# Patient Record
Sex: Male | Born: 1995 | Race: White | Hispanic: No | Marital: Single | Smoking: Current every day smoker
Health system: Southern US, Community
[De-identification: ages and names within clinical notes are randomized; demographics above are authoritative.]

## PROBLEM LIST (undated history)

## (undated) DIAGNOSIS — F329 Major depressive disorder, single episode, unspecified: Secondary | ICD-10-CM

## (undated) DIAGNOSIS — G43909 Migraine, unspecified, not intractable, without status migrainosus: Secondary | ICD-10-CM

## (undated) DIAGNOSIS — F32A Depression, unspecified: Secondary | ICD-10-CM

## (undated) DIAGNOSIS — E079 Disorder of thyroid, unspecified: Secondary | ICD-10-CM

---
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
## 2015-08-22 ENCOUNTER — Emergency Department (HOSPITAL_COMMUNITY)
Admission: EM | Admit: 2015-08-22 | Discharge: 2015-08-22 | Disposition: A | Discharge status: Home / Self Care | Payer: Medicaid Other | Attending: Emergency Medicine | Admitting: Emergency Medicine

## 2015-08-22 ENCOUNTER — Encounter (HOSPITAL_COMMUNITY): Payer: Self-pay | Admitting: Emergency Medicine

## 2015-08-22 DIAGNOSIS — Z8679 Personal history of other diseases of the circulatory system: Secondary | ICD-10-CM | POA: Insufficient documentation

## 2015-08-22 DIAGNOSIS — Z720 Tobacco use: Secondary | ICD-10-CM | POA: Diagnosis not present

## 2015-08-22 DIAGNOSIS — E079 Disorder of thyroid, unspecified: Secondary | ICD-10-CM | POA: Diagnosis not present

## 2015-08-22 DIAGNOSIS — L02415 Cutaneous abscess of right lower limb: Secondary | ICD-10-CM | POA: Diagnosis present

## 2015-08-22 DIAGNOSIS — Z8659 Personal history of other mental and behavioral disorders: Secondary | ICD-10-CM | POA: Diagnosis not present

## 2015-08-22 DIAGNOSIS — Z79899 Other long term (current) drug therapy: Secondary | ICD-10-CM | POA: Diagnosis not present

## 2015-08-22 DIAGNOSIS — Z23 Encounter for immunization: Secondary | ICD-10-CM | POA: Insufficient documentation

## 2015-08-22 DIAGNOSIS — L03115 Cellulitis of right lower limb: Secondary | ICD-10-CM | POA: Diagnosis not present

## 2015-08-22 MED ORDER — HYDROCODONE-ACETAMINOPHEN 5-325 MG PO TABS: 1.0000 | ORAL_TABLET | Freq: Four times a day (QID) | ORAL | Status: DC | PRN

## 2015-08-22 MED ORDER — TRAMADOL HCL 50 MG PO TABS
50.0000 mg | ORAL_TABLET | Freq: Once | ORAL | Status: DC
  Filled 2015-08-22: qty 1

## 2015-08-22 MED ORDER — LIDOCAINE-EPINEPHRINE 2 %-1:100000 IJ SOLN
20.0000 mL | Freq: Once | INTRAMUSCULAR | Status: AC
  Administered 2015-08-22: 20 mL via INTRADERMAL
  Filled 2015-08-22: qty 1

## 2015-08-22 MED ORDER — LIDOCAINE-EPINEPHRINE 1 %-1:100000 IJ SOLN
INTRAMUSCULAR | Status: AC
  Filled 2015-08-22: qty 1

## 2015-08-22 MED ORDER — DOXYCYCLINE HYCLATE 100 MG PO CAPS: 100.0000 mg | ORAL_CAPSULE | Freq: Two times a day (BID) | ORAL | Status: DC

## 2015-08-22 MED ORDER — TETANUS-DIPHTH-ACELL PERTUSSIS 5-2.5-18.5 LF-MCG/0.5 IM SUSP
0.5000 mL | Freq: Once | INTRAMUSCULAR | Status: AC
  Administered 2015-08-22: 0.5 mL via INTRAMUSCULAR
  Filled 2015-08-22: qty 0.5

## 2015-08-22 MED ORDER — DOXYCYCLINE HYCLATE 100 MG PO TABS
100.0000 mg | ORAL_TABLET | Freq: Once | ORAL | Status: AC
  Administered 2015-08-22: 100 mg via ORAL
  Filled 2015-08-22: qty 1

## 2015-08-22 NOTE — Discharge Instructions (Signed)
Apply warm compress to affected area 4 times daily.  Take antibiotic and pain medication as prescribed.  Follow up with your doctor or return in 48 hrs for wound recheck and packing removal.   Cellulitis Cellulitis is an infection of the skin and the tissue beneath it. The infected area is usually red and tender. Cellulitis occurs most often in the arms and lower legs.  CAUSES  Cellulitis is caused by bacteria that enter the skin through cracks or cuts in the skin. The most common types of bacteria that cause cellulitis are staphylococci and streptococci. SIGNS AND SYMPTOMS   Redness and warmth.  Swelling.  Tenderness or pain.  Fever. DIAGNOSIS  Your health care provider can usually determine what is wrong based on a physical exam. Blood tests may also be done. TREATMENT  Treatment usually involves taking an antibiotic medicine. HOME CARE INSTRUCTIONS   Take your antibiotic medicine as directed by your health care provider. Finish the antibiotic even if you start to feel better.  Keep the infected arm or leg elevated to reduce swelling.  Apply a warm cloth to the affected area up to 4 times per day to relieve pain.  Take medicines only as directed by your health care provider.  Keep all follow-up visits as directed by your health care provider. SEEK MEDICAL CARE IF:   You notice red streaks coming from the infected area.  Your red area gets larger or turns dark in color.  Your bone or joint underneath the infected area becomes painful after the skin has healed.  Your infection returns in the same area or another area.  You notice a swollen bump in the infected area.  You develop new symptoms.  You have a fever. SEEK IMMEDIATE MEDICAL CARE IF:   You feel very sleepy.  You develop vomiting or diarrhea.  You have a general ill feeling (malaise) with muscle aches and pains.   This information is not intended to replace advice given to you by your health care provider.  Make sure you discuss any questions you have with your health care provider.   Document Released: 08/06/2005 Document Revised: 07/18/2015 Document Reviewed: 01/12/2012 Elsevier Interactive Patient Education 2016 [employer]  Abscess An abscess (boil or furuncle) is an infected area on or under the skin. This area is filled with yellowish-white fluid (pus) and other material (debris). HOME CARE   Only take medicines as told by your doctor.  If you were given antibiotic medicine, take it as directed. Finish the medicine even if you start to feel better.  If gauze is used, follow your doctor's directions for changing the gauze.  To avoid spreading the infection:  Keep your abscess covered with a bandage.  Wash your hands well.  Do not share personal care items, towels, or whirlpools with others.  Avoid skin contact with others.  Keep your skin and clothes clean around the abscess.  Keep all doctor visits as told. GET HELP RIGHT AWAY IF:   You have more pain, puffiness (swelling), or redness in the wound site.  You have more fluid or blood coming from the wound site.  You have muscle aches, chills, or you feel sick.  You have a fever. MAKE SURE YOU:   Understand these instructions.  Will watch your condition.  Will get help right away if you are not doing well or get worse.   This information is not intended to replace advice given to you by your health care provider. Make sure  you discuss any questions you have with your health care provider.   Document Released: 04/14/2008 Document Revised: 04/27/2012 Document Reviewed: 01/10/2012 Elsevier Interactive Patient Education [employer]

## 2015-08-22 NOTE — ED Notes (Signed)
Pt c/o abscess to back of rt calf x 4 days.  Has multiple sores on lower leg.  States that these have been there for years.

## 2015-08-22 NOTE — ED Provider Notes (Signed)
CSN: [id number]     Arrival date & time 08/22/15  1350 History   Chief Complaint  Patient presents with  . Abscess   The history is provided by the patient. No language interpreter was used.   HPI Comments: [name] is a 19 y.o. male who presents to the Emergency Department complaining of abscess to the back of his right calf for 4 days. Patient report 1 weeks ago he was walking the woods and he thinks he may have been bitten by something to his right calf. States it did not really bother him until the past 4 days he has noticed increased pain and swelling and redness. Describe pain as a throbbing sensation, persistent, worsening with palpation. Denies having fever chills abdominal cramping nausea vomiting or diarrhea. Denies any specific injury. No prior history of PE or DVT. 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 He is unable to recall last tetanus status. He has been taken at home without adequate relief.   Past Medical History  Diagnosis Date  . Migraines   . Thyroid disease   . Depression    Past Surgical History  Procedure Laterality Date  . I&d extremity Left 01/27/2014    Procedure: IRRIGATION AND DEBRIDEMENT EXTREMITY LEFT WRIST;  Surgeon: Tami Ribas, MD;  Location: [facility] OR;  Service: Orthopedics;  Laterality: Left;   No family history on file. Social History  Substance Use Topics  . Smoking status: Current Every Day Smoker  . Smokeless tobacco: None  . Alcohol Use: No    Review of Systems  Constitutional: Negative for fever.  Skin: Positive for rash.  Neurological: Negative for numbness.    Allergies  Sulfa antibiotics  Home Medications   Prior to Admission medications   Medication Sig Start Date End Date Taking? Authorizing Provider  acetaminophen (TYLENOL) 500 MG tablet Take 1,000-2,000 mg by mouth every 6 (six) hours as needed (for pain).     Historical Provider, MD  cephALEXin (KEFLEX) 500 MG  capsule Take 2 capsules (1,000 mg total) by mouth 2 (two) times daily. 08/28/14   Fayrene Helper, PA-C  clindamycin (CLEOCIN) 150 MG capsule Take 1 capsule (150 mg total) by mouth every 6 (six) hours. 08/28/14   Fayrene Helper, PA-C  levothyroxine (SYNTHROID, LEVOTHROID) 88 MCG tablet Take 88 mcg by mouth daily.      Historical Provider, MD  oxyCODONE-acetaminophen (PERCOCET/ROXICET) 5-325 MG per tablet Take 2 tablets by mouth every 6 (six) hours as needed for severe pain. 08/28/14   Fayrene Helper, PA-C   BP 137/82 mmHg  Pulse 136  Temp(Src) 99.2 F (37.3 C) (Oral)  Resp 20  SpO2 99% Physical Exam  Constitutional: He appears well-developed and well-nourished. No distress.  HENT:  Head: Atraumatic.  Eyes: Conjunctivae are normal.  Neck: Neck supple.  Cardiovascular: Intact distal pulses.   Neurological: He is alert.  Skin: No rash (right medial calf region with an area of induration, fluctuance, and surrounding skin erythema measuring 4 x 7 cm, tenderness to palpation. Compartment is soft.) noted.  Psychiatric: He has a normal mood and affect.  Nursing note and vitals reviewed.  ED Course  Procedures  DIAGNOSTIC STUDIES: Oxygen Saturation is 99% on RA, normal by my interpretation.    COORDINATION OF CARE: 3:07 PM  Pt developed abscess cellulitis to R calf from a ?insect bite 1 week ago.  Successful I&D performed by me.  No report of tick bite.  Pt is allergic  to Sulfa.  Plan to d/c with doxy and 48 hrs f/u.  INCISION AND DRAINAGE Performed by: Fayrene HelperRAN,Camira Geidel Consent: Verbal consent obtained. Risks and benefits: risks, benefits and alternatives were discussed Type: abscess  Body area: R calf region  Anesthesia: local infiltration  Incision was made with a scalpel.  Local anesthetic: lidocaine 2% w epinephrine  Anesthetic total: 4 ml  Complexity: complex Blunt dissection to break up loculations  Drainage: purulent  Drainage amount: moderate  Packing material: 1/4 in iodoform  gauze  Patient tolerance: Patient tolerated the procedure well with no immediate complications.      Labs Review Labs Reviewed - No data to display  Imaging Review No results found. I have personally reviewed and evaluated lab results as part of my medical decision-making.   EKG Interpretation None      MDM   Final diagnoses:  Abscess of right lower leg  Cellulitis of right lower leg   BP 128/61 mmHg  Pulse 97  Temp(Src) 99 F (37.2 C) (Oral)  Resp 18  SpO2 100%   I, Dastan Krider, personally performed the services described in this documentation. All medical record entries made by the scribe were at my direction and in my presence.  I have reviewed the chart and discharge instructions and agree that the record reflects my personal performance and is accurate and complete. Emylee Decelle.  08/22/2015. 4:43 PM.      Fayrene HelperBowie Daelyn Mozer, PA-C 08/22/15 1643  Fayrene HelperBowie Militza Devery, PA-C 08/22/15 1644  Doug SouSam Jacubowitz, MD 08/23/15 [phone]

## 2016-06-18 IMAGING — CR DG FOREARM 2V*R*
2 series · 2 of 2 positions shown · non-contrast
Comparison: None.

CLINICAL DATA: Puncture wound 1 week ago approximately 1 inch above
the wrist now with swelling and erythema here

EXAM:
RIGHT FOREARM - 2 VIEW

[x forearm ap right]
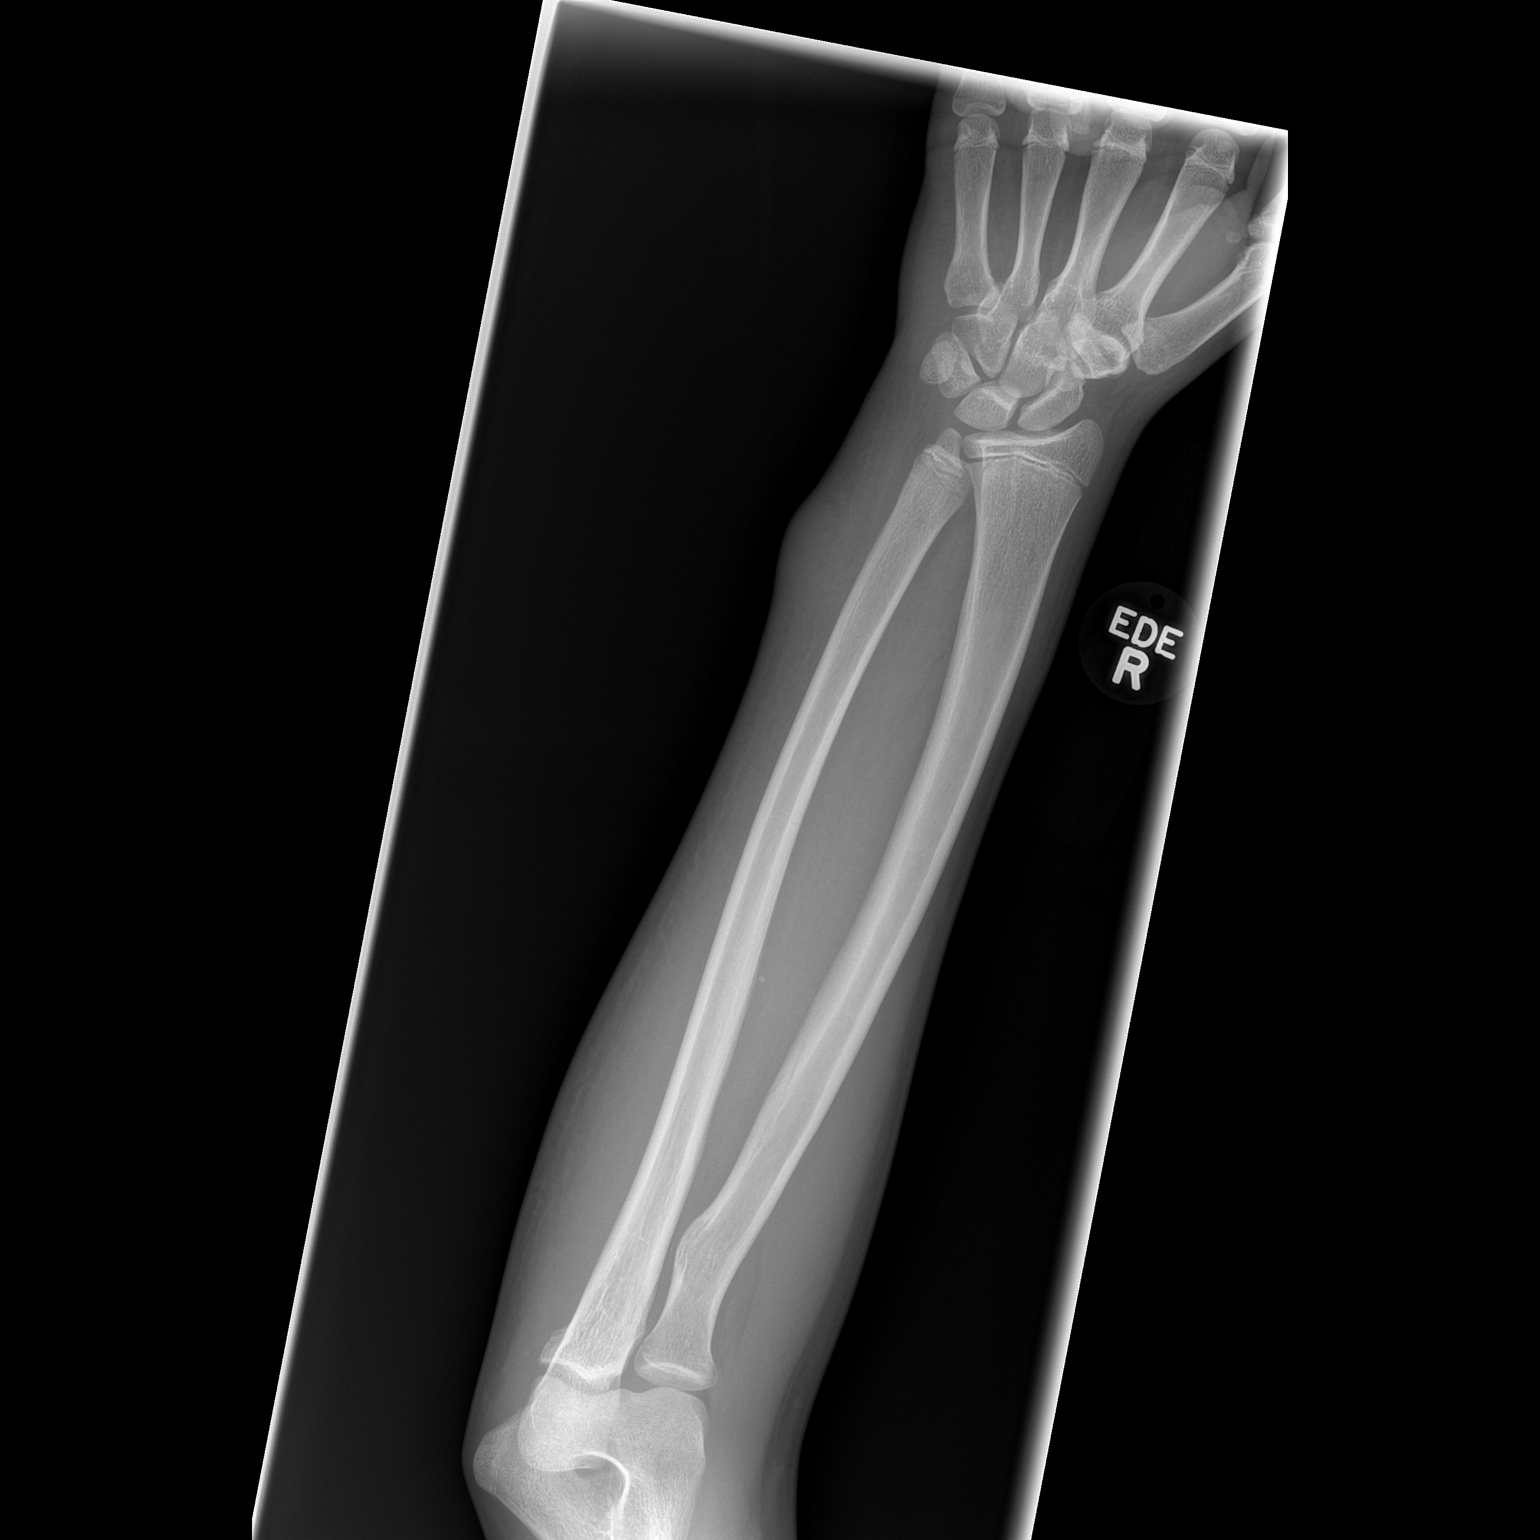

[x forearm lat right]
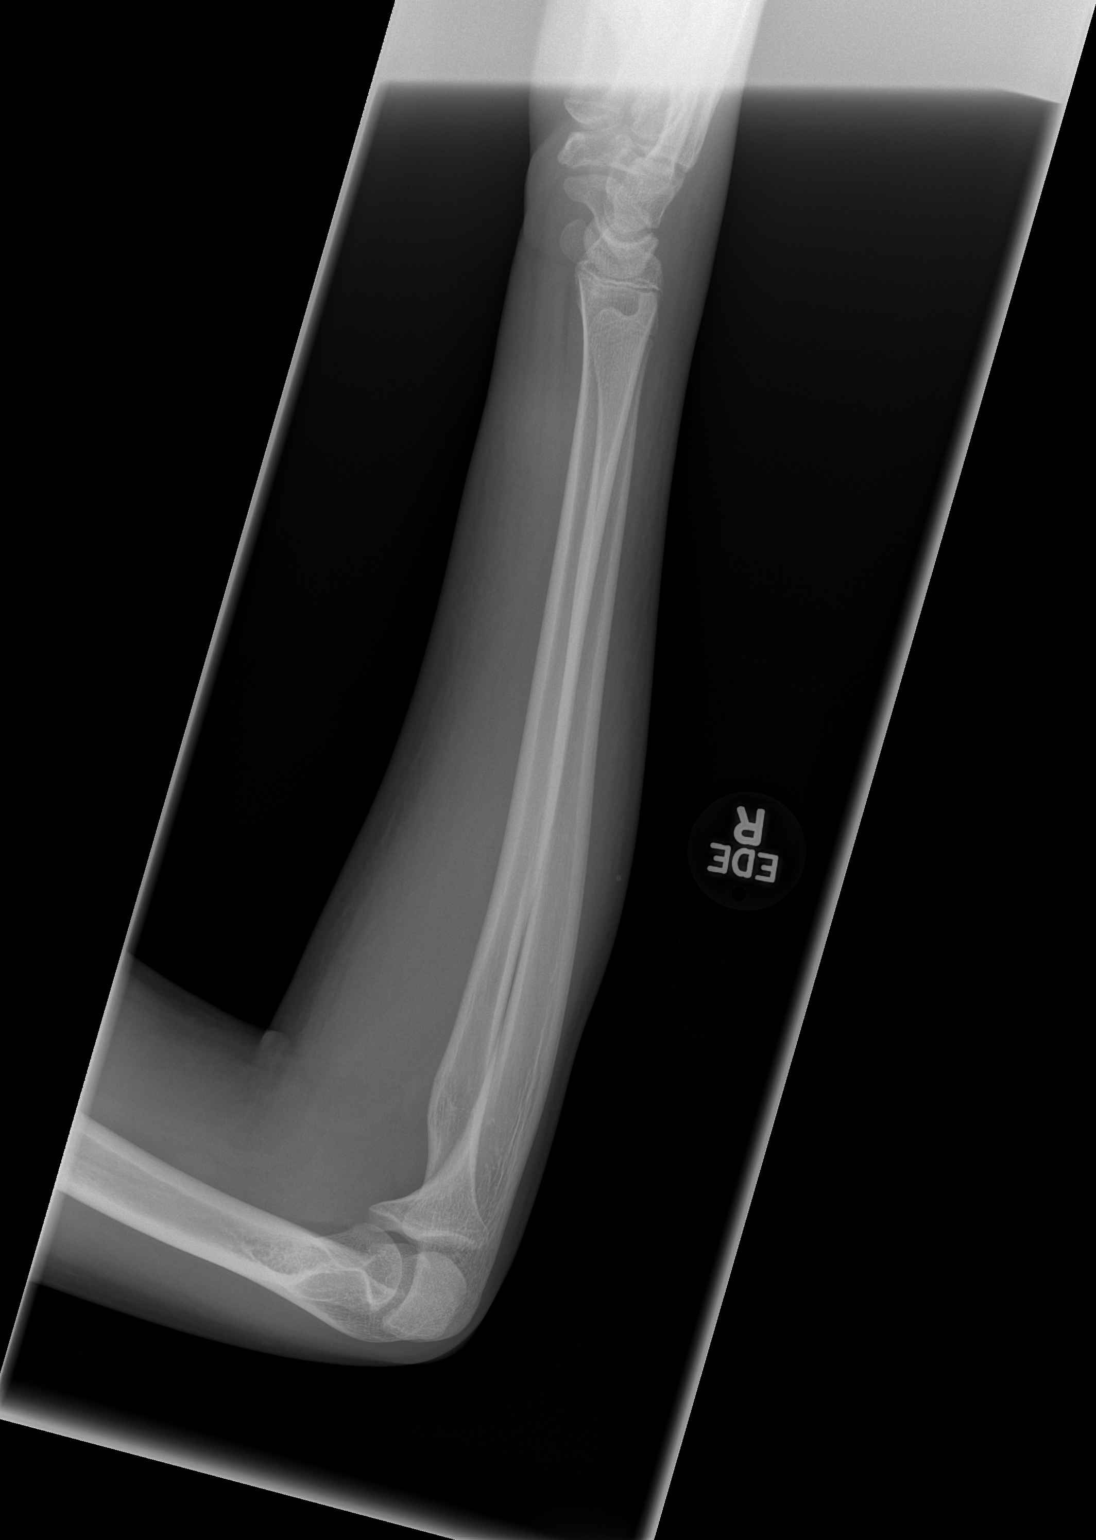

[2 of 2 positions shown; findings below may reference images not displayed]

FINDINGS: There is soft tissue swelling just proximal to the wrist along the
ulnar side of the forearm. There is no foreign body demonstrated and
no definite soft tissue gas. No abnormality of the radius or ulna is
demonstrated.
IMPRESSION: There is soft tissue swelling consistent with clinically suspected
cellulitis. There is no foreign body nor evidence of bony change.

## 2017-03-21 ENCOUNTER — Encounter (HOSPITAL_COMMUNITY): Payer: Medicaid Other

## 2017-03-21 ENCOUNTER — Observation Stay (HOSPITAL_COMMUNITY)
Admission: EM | Admit: 2017-03-21 | Discharge: 2017-03-22 | Disposition: A | Discharge status: Home / Self Care | Payer: Medicaid Other | Attending: Student in an Organized Health Care Education/Training Program | Admitting: Student in an Organized Health Care Education/Training Program

## 2017-03-21 ENCOUNTER — Encounter (HOSPITAL_COMMUNITY): Payer: Self-pay | Admitting: Emergency Medicine

## 2017-03-21 ENCOUNTER — Observation Stay (HOSPITAL_BASED_OUTPATIENT_CLINIC_OR_DEPARTMENT_OTHER): Payer: Medicaid Other

## 2017-03-21 DIAGNOSIS — Z8614 Personal history of Methicillin resistant Staphylococcus aureus infection: Secondary | ICD-10-CM | POA: Diagnosis not present

## 2017-03-21 DIAGNOSIS — Z886 Allergy status to analgesic agent status: Secondary | ICD-10-CM | POA: Insufficient documentation

## 2017-03-21 DIAGNOSIS — Z9889 Other specified postprocedural states: Secondary | ICD-10-CM | POA: Insufficient documentation

## 2017-03-21 DIAGNOSIS — F17210 Nicotine dependence, cigarettes, uncomplicated: Secondary | ICD-10-CM | POA: Diagnosis not present

## 2017-03-21 DIAGNOSIS — F1910 Other psychoactive substance abuse, uncomplicated: Secondary | ICD-10-CM

## 2017-03-21 DIAGNOSIS — M7989 Other specified soft tissue disorders: Secondary | ICD-10-CM | POA: Diagnosis not present

## 2017-03-21 DIAGNOSIS — L309 Dermatitis, unspecified: Secondary | ICD-10-CM | POA: Diagnosis not present

## 2017-03-21 DIAGNOSIS — G43909 Migraine, unspecified, not intractable, without status migrainosus: Secondary | ICD-10-CM | POA: Insufficient documentation

## 2017-03-21 DIAGNOSIS — M79609 Pain in unspecified limb: Secondary | ICD-10-CM | POA: Diagnosis not present

## 2017-03-21 DIAGNOSIS — F12120 Cannabis abuse with intoxication, uncomplicated: Secondary | ICD-10-CM | POA: Diagnosis not present

## 2017-03-21 DIAGNOSIS — F1110 Opioid abuse, uncomplicated: Secondary | ICD-10-CM | POA: Diagnosis not present

## 2017-03-21 DIAGNOSIS — M25461 Effusion, right knee: Secondary | ICD-10-CM | POA: Diagnosis not present

## 2017-03-21 DIAGNOSIS — F1120 Opioid dependence, uncomplicated: Secondary | ICD-10-CM

## 2017-03-21 DIAGNOSIS — F329 Major depressive disorder, single episode, unspecified: Secondary | ICD-10-CM | POA: Insufficient documentation

## 2017-03-21 DIAGNOSIS — E063 Autoimmune thyroiditis: Secondary | ICD-10-CM | POA: Diagnosis not present

## 2017-03-21 DIAGNOSIS — K5900 Constipation, unspecified: Secondary | ICD-10-CM | POA: Diagnosis not present

## 2017-03-21 DIAGNOSIS — L03113 Cellulitis of right upper limb: Secondary | ICD-10-CM | POA: Diagnosis not present

## 2017-03-21 DIAGNOSIS — R202 Paresthesia of skin: Secondary | ICD-10-CM | POA: Diagnosis not present

## 2017-03-21 DIAGNOSIS — Z882 Allergy status to sulfonamides status: Secondary | ICD-10-CM | POA: Insufficient documentation

## 2017-03-21 DIAGNOSIS — M029 Reactive arthropathy, unspecified: Secondary | ICD-10-CM | POA: Insufficient documentation

## 2017-03-21 DIAGNOSIS — Z833 Family history of diabetes mellitus: Secondary | ICD-10-CM | POA: Diagnosis not present

## 2017-03-21 DIAGNOSIS — L0390 Cellulitis, unspecified: Secondary | ICD-10-CM | POA: Diagnosis present

## 2017-03-21 LAB — COMPREHENSIVE METABOLIC PANEL WITH GFR
ALT: 22 U/L (ref 17–63)
AST: 37 U/L (ref 15–41)
Albumin: 4.3 g/dL (ref 3.5–5.0)
Alkaline Phosphatase: 65 U/L (ref 38–126)
Anion gap: 13 (ref 5–15)
BUN: 15 mg/dL (ref 6–20)
CO2: 20 mmol/L — ABNORMAL LOW (ref 22–32)
Calcium: 8.8 mg/dL — ABNORMAL LOW (ref 8.9–10.3)
Chloride: 100 mmol/L — ABNORMAL LOW (ref 101–111)
Creatinine, Ser: 1.31 mg/dL — ABNORMAL HIGH (ref 0.61–1.24)
GFR calc Af Amer: >60 mL/min (ref >60)
GFR calc non Af Amer: >60 mL/min (ref >60)
Glucose, Bld: 148 mg/dL — ABNORMAL HIGH (ref 65–99)
Potassium: 4.7 mmol/L (ref 3.5–5.1)
Sodium: 133 mmol/L — ABNORMAL LOW (ref 135–145)
Total Bilirubin: 1 mg/dL (ref 0.3–1.2)
Total Protein: 7 g/dL (ref 6.5–8.1)

## 2017-03-21 LAB — CBC WITH DIFFERENTIAL/PLATELET
BASOS ABS: 0 10*3/uL (ref 0.0–0.1)
BASOS PCT: 0 %
Eosinophils Absolute: 0.3 10*3/uL (ref 0.0–0.7)
Eosinophils Relative: 3 %
HEMATOCRIT: 46.7 % (ref 39.0–52.0)
HEMOGLOBIN: 16.1 g/dL (ref 13.0–17.0)
Lymphocytes Relative: 3 %
Lymphs Abs: 0.3 10*3/uL — ABNORMAL LOW (ref 0.7–4.0)
MCH: 30.1 pg (ref 26.0–34.0)
MCHC: 34.5 g/dL (ref 30.0–36.0)
MCV: 87.5 fL (ref 78.0–100.0)
MONOS PCT: 4 %
Monocytes Absolute: 0.4 10*3/uL (ref 0.1–1.0)
NEUTROS ABS: 9 10*3/uL — AB (ref 1.7–7.7)
Neutrophils Relative %: 90 %
Platelets: 173 10*3/uL (ref 150–400)
RBC: 5.34 MIL/uL (ref 4.22–5.81)
RDW: 13.1 % (ref 11.5–15.5)
WBC: 10 10*3/uL (ref 4.0–10.5)

## 2017-03-21 LAB — RAPID URINE DRUG SCREEN, HOSP PERFORMED
Amphetamines: NOT DETECTED
Barbiturates: NOT DETECTED
Benzodiazepines: POSITIVE — AB
Cocaine: NOT DETECTED
Opiates: NOT DETECTED
Tetrahydrocannabinol: POSITIVE — AB

## 2017-03-21 LAB — URINALYSIS, ROUTINE W REFLEX MICROSCOPIC
BILIRUBIN URINE: NEGATIVE
Glucose, UA: NEGATIVE mg/dL
Hgb urine dipstick: NEGATIVE
KETONES UR: NEGATIVE mg/dL
LEUKOCYTES UA: NEGATIVE
NITRITE: NEGATIVE
Protein, ur: NEGATIVE mg/dL
SPECIFIC GRAVITY, URINE: 1.02 (ref 1.005–1.030)
pH: 6 (ref 5.0–8.0)

## 2017-03-21 LAB — MRSA PCR SCREENING: MRSA BY PCR: NEGATIVE

## 2017-03-21 MED ORDER — ENOXAPARIN SODIUM 40 MG/0.4ML ~~LOC~~ SOLN
40.0000 mg | SUBCUTANEOUS | Status: DC
  Administered 2017-03-21: 40 mg via SUBCUTANEOUS
  Filled 2017-03-21: qty 0.4

## 2017-03-21 MED ORDER — SODIUM CHLORIDE 0.9 % IV SOLN
INTRAVENOUS | Status: AC
  Administered 2017-03-21 (×2): via INTRAVENOUS

## 2017-03-21 MED ORDER — CEFAZOLIN SODIUM-DEXTROSE 1-4 GM/50ML-% IV SOLN
1.0000 g | Freq: Three times a day (TID) | INTRAVENOUS | Status: DC
  Administered 2017-03-21 – 2017-03-22 (×4): 1 g via INTRAVENOUS
  Filled 2017-03-21 (×5): qty 50

## 2017-03-21 MED ORDER — LEVOTHYROXINE SODIUM 75 MCG PO TABS
175.0000 ug | ORAL_TABLET | Freq: Every day | ORAL | Status: DC
  Administered 2017-03-22: 175 ug via ORAL
  Filled 2017-03-21: qty 1

## 2017-03-21 MED ORDER — NICOTINE 14 MG/24HR TD PT24
14.0000 mg | MEDICATED_PATCH | TRANSDERMAL | Status: DC
  Administered 2017-03-21: 14 mg via TRANSDERMAL
  Filled 2017-03-21: qty 1

## 2017-03-21 MED ORDER — BUPRENORPHINE HCL-NALOXONE HCL 8-2 MG SL SUBL
1.0000 | SUBLINGUAL_TABLET | Freq: Two times a day (BID) | SUBLINGUAL | Status: DC
  Administered 2017-03-21 – 2017-03-22 (×3): 1 via SUBLINGUAL
  Filled 2017-03-21 (×3): qty 1

## 2017-03-21 MED ORDER — KETOROLAC TROMETHAMINE 15 MG/ML IJ SOLN
15.0000 mg | Freq: Once | INTRAMUSCULAR | Status: AC
  Administered 2017-03-21: 15 mg via INTRAVENOUS
  Filled 2017-03-21: qty 1

## 2017-03-21 MED ORDER — TRAMADOL HCL 50 MG PO TABS
50.0000 mg | ORAL_TABLET | Freq: Four times a day (QID) | ORAL | Status: DC | PRN
  Administered 2017-03-22 (×2): 50 mg via ORAL
  Filled 2017-03-21 (×2): qty 1

## 2017-03-21 MED ORDER — CLINDAMYCIN PHOSPHATE 600 MG/50ML IV SOLN
600.0000 mg | Freq: Once | INTRAVENOUS | Status: AC
  Administered 2017-03-21: 600 mg via INTRAVENOUS
  Filled 2017-03-21 (×2): qty 50

## 2017-03-21 NOTE — Progress Notes (Signed)
Patient trasfered from ED to 5W30 via stretcher alert and oriented x 4; complaints of pain in right arm; IV saline locked in LUA. Orient patient to room and unit; gave patient care guide; instructed how to use the call bell and  fall risk precautions. Will continue to monitor the patient.

## 2017-03-21 NOTE — ED Triage Notes (Signed)
C/o R arm pain since yesterday.  States pain is sharp and causing numbness to fingers.  Arm redness noted on arrival that seems to have improved.  Mom states pt took antibiotic yesterday that he is allergic to.  C/o nausea and vomiting.  Pt crying in pain on arrival.

## 2017-03-21 NOTE — Progress Notes (Signed)
MD was called to informed that lab couldn't draw blood from his left hand (right hand being with cellulitis). MD said that we can wait until tomorrow to try again to draw blood. Will continue to monitor.

## 2017-03-21 NOTE — Progress Notes (Signed)
MD was paged to obtain an order for VTE. Will continue to monitor.

## 2017-03-21 NOTE — Progress Notes (Addendum)
VASCULAR LAB PRELIMINARY  PRELIMINARY  PRELIMINARY  PRELIMINARY  Right upper extremity venous duplex completed.    Preliminary report:  There is no  Acute DVT or SVT noted in the right upper extremity.   The right cephalic and basilic veins are thickened.  There is an enlarged lymph node noted.  Kaytlan Behrman, RVT 03/21/2017, 4:48 PM

## 2017-03-21 NOTE — ED Notes (Signed)
IV team at bedside 

## 2017-03-21 NOTE — Progress Notes (Signed)
Requesting a nicotine patch.  Message sent to resident on call.

## 2017-03-21 NOTE — ED Provider Notes (Signed)
[facility]-EMERGENCY DEPT Provider Note   CSN: [id number] Arrival date & time: 03/21/17  0506     History   Chief Complaint Chief Complaint  Patient presents with  . Arm Pain  . Emesis    HPI [name] is a 21 y.o. male.  Patient is a 21 year old male with history of IV drug abuse. He presents today for evaluation of right arm redness, pain, swelling, and numbness to his fingertips. This is been worsening over the past 2 days. He admits to injecting his Suboxone. He denies any fevers or chills. He denies any chest pain or difficulty breathing. He began taking an antibiotic that his mother had at home. This antibiotic was a sulfa antibiotic which the patient is reportedly allergic to.   The history is provided by the patient.  Arm Pain  This is a new problem. The current episode started 2 days ago. The problem occurs constantly. The problem has been gradually worsening. Pertinent negatives include no chest pain and no shortness of breath. Nothing aggravates the symptoms. Nothing relieves the symptoms. Treatments tried: Bactrim. The treatment provided no relief.  Emesis      Past Medical History:  Diagnosis Date  . Depression   . Migraines   . Thyroid disease     Patient Active Problem List   Diagnosis Date Noted  . Abscess of left forearm 01/27/2014  . Migraines 01/27/2014  . History of MRSA infection 01/27/2014  . Hypothyroidism due to Hashimoto's thyroiditis 04/28/2011  . Short stature 04/28/2011    Past Surgical History:  Procedure Laterality Date  . I&D EXTREMITY Left 01/27/2014   Procedure: IRRIGATION AND DEBRIDEMENT EXTREMITY LEFT WRIST;  Surgeon: Tami RibasKevin R Kuzma, MD;  Location: [facility] OR;  Service: Orthopedics;  Laterality: Left;       Home Medications    Prior to Admission medications   Medication Sig Start Date End Date Taking? Authorizing Provider  ibuprofen (ADVIL,MOTRIN) 200 MG tablet Take 200-600 mg by mouth every 6 (six) hours as needed for moderate  pain.   Yes [provider]  levothyroxine (SYNTHROID, LEVOTHROID) 175 MCG tablet Take 175 mcg by mouth daily before breakfast.   Yes [provider]    Family History No family history on file.  Social History Social History  Substance Use Topics  . Smoking status: Current Every Day Smoker  . Smokeless tobacco: Never Used  . Alcohol use No     Allergies   Tylenol [acetaminophen] and Sulfa antibiotics   Review of Systems Review of Systems  Respiratory: Negative for shortness of breath.   Cardiovascular: Negative for chest pain.  Gastrointestinal: Positive for vomiting.  All other systems reviewed and are negative.    Physical Exam Updated Vital Signs BP (!) 109/59 (BP Location: Left Arm)   Pulse 70   Temp 98.4 F (36.9 C) (Oral)   Resp 16   SpO2 99%   Physical Exam  Constitutional: He is oriented to person, place, and time. He appears well-developed and well-nourished. No distress.  HENT:  Head: Normocephalic and atraumatic.  Mouth/Throat: Oropharynx is clear and moist.  Neck: Normal range of motion. Neck supple.  Cardiovascular: Normal rate and regular rhythm.  Exam reveals no friction rub.   No murmur heard. Pulmonary/Chest: Effort normal and breath sounds normal. No respiratory distress. He has no wheezes. He has no rales.  Abdominal: Soft. Bowel sounds are normal. He exhibits no distension. There is no tenderness.  Musculoskeletal: Normal range of motion. He exhibits no edema.  The right arm is erythematous, warm to the touch extending from the mid bicep into the forearm and hand. It is tender to palpation. There is no definite abscess or fluctuance. He does have multiple track marks. Ulnar and radial pulses are easily palpable. Sensation is intact to all fingers. He is able to flex and extend all fingers.  Neurological: He is alert and oriented to person, place, and time. Coordination normal.  Skin: Skin is warm and dry. He is not diaphoretic.   Nursing note and vitals reviewed.    ED Treatments / Results  Labs (all labs ordered are listed, but only abnormal results are displayed) Labs Reviewed  COMPREHENSIVE METABOLIC PANEL - Abnormal; Notable for the following:       Result Value   Sodium 133 (*)    Chloride 100 (*)    CO2 20 (*)    Glucose, Bld 148 (*)    Creatinine, Ser 1.31 (*)    Calcium 8.8 (*)    All other components within normal limits  CBC WITH DIFFERENTIAL/PLATELET - Abnormal; Notable for the following:    Neutro Abs 9.0 (*)    Lymphs Abs 0.3 (*)    All other components within normal limits  URINALYSIS, ROUTINE W REFLEX MICROSCOPIC  I-STAT CG4 LACTIC ACID, ED    EKG  EKG Interpretation None       Radiology No results found.  Procedures Procedures (including critical care time)  Medications Ordered in ED Medications  clindamycin (CLEOCIN) IVPB 600 mg (not administered)     Initial Impression / Assessment and Plan / ED Course  I have reviewed the triage vital signs and the nursing notes.  Pertinent labs & imaging results that were available during my care of the patient were reviewed by me and considered in my medical decision making (see chart for details).  Patient with significant right arm cellulitis resulting from IV drug abuse. There is no fluctuance or definite abscess that appears to be drainable. I feel as though the extent of cellulitis is out of proportion with what oral antibiotics will successfully treat and believe he should be admitted for IV antibiotics. He was given IV clindamycin here in the emergency department and will be admitted to the hospitalist service.  Final Clinical Impressions(s) / ED Diagnoses   Final diagnoses:  None    New Prescriptions New Prescriptions   No medications on file     Geoffery Lyons, MD 03/21/17 [phone]

## 2017-03-21 NOTE — Consult Note (Signed)
ORTHOPAEDIC CONSULTATION  REQUESTING PHYSICIAN: [name], Duncan Thomas, *  PCP:  Kaleen MaskElkins, Wilson Oliver, MD  Chief Complaint: Right upper extremity pain and erythema  HPI: [name] is a 21 y.o. male with a long-standing history of IV drug abuse. The patient states that he has been injecting Suboxone into his right upper extremity in an attempt to wean himself off of IV opioids. Over the past couple of days, he noticed increasing pain and erythema around the injection site just superior to his elbow. He took antibiotics from a family member and had an allergic reaction. He had been having some paresthesias of his small and ring fingers which he states are nearly resolved. Patient states that when he woke up this morning he noticed some paresthesias to the thumb, index, and long fingers. He thinks these symptoms are due to sleeping with his elbows and wrists in a flexed position. He states that this is getting better. Orthopedic consultation was placed to rule out compartment syndrome.  Past Medical History:  Diagnosis Date  . Depression   . Migraines   . Thyroid disease    Past Surgical History:  Procedure Laterality Date  . I&D EXTREMITY Left 01/27/2014   Procedure: IRRIGATION AND DEBRIDEMENT EXTREMITY LEFT WRIST;  Surgeon: Tami RibasKevin R Kuzma, MD;  Location: [facility] OR;  Service: Orthopedics;  Laterality: Left;   Social History   Social History  . Marital status: Single    Spouse name: N/A  . Number of children: N/A  . Years of education: N/A   Social History Main Topics  . Smoking status: Current Every Day Smoker    Packs/day: 0.50    Years: 6.00    Types: Cigarettes  . Smokeless tobacco: Never Used  . Alcohol use No  . Drug use: No     Comment: suboxone  . Sexual activity: Yes    Birth control/ protection: Condom   Other Topics Concern  . None   Social History Narrative  . None   Family History  Problem Relation Age of Onset  . Diabetes Father   . Thyroid disease  Father    Allergies  Allergen Reactions  . Tylenol [Acetaminophen] Other (See Comments)    "Per pt Hurts my stomach"  . Sulfa Antibiotics Rash   Prior to Admission medications   Medication Sig Start Date End Date Taking? Authorizing Provider  ibuprofen (ADVIL,MOTRIN) 200 MG tablet Take 200-600 mg by mouth every 6 (six) hours as needed for moderate pain.   Yes [provider]  levothyroxine (SYNTHROID, LEVOTHROID) 175 MCG tablet Take 175 mcg by mouth daily before breakfast.   Yes [provider]   No results found.  Positive ROS: All other systems have been reviewed and were otherwise negative with the exception of those mentioned in the HPI and as above.  Physical Exam: General: Alert, no acute distress Cardiovascular: No pedal edema Respiratory: No cyanosis, no use of accessory musculature GI: No organomegaly, abdomen is soft and non-tender Skin: No lesions in the area of chief complaint Neurologic: Sensation intact distally Psychiatric: Patient is competent for consent with normal mood and affect Lymphatic: No axillary or cervical lymphadenopathy  MUSCULOSKELETAL: Examination of the right upper extremity reveals multiple track marks in the region of the basilic vein just superior to the antecubital fossa. He has mild swelling around this area. He has erythema from this area extending down into the forearm. There is no swelling of the forearm or hand. There is no thenar atrophy. He  has intact motor function AIN, PIN, ulnar, musculocutaneous, and axillary nerve distributions. He has excellent strength with thumb palmar abduction. There is no pain with active range of motion of his hand. There is no fluctuance or evidence of abscess. He has a palpable radial pulse.  Assessment: IV drug abuse Right upper extremity cellulitis No evidence of abscess or impending compartment syndrome  Plan: I discussed the findings with the patient. I counseled him to stop injecting  substances. There is no evidence of impending compartment syndrome or abscess. I think his median nerve distribution paresthesias are likely positional in nature. There is no evidence of acute carpal tunnel syndrome. I recommend symptomatic treatment with elevation and non-opiate analgesics. IV antibiotics for cellulitis per primary hospitalist team. As there is no surgical indication at this time, orthopedics will sign off.    Melora Menon, Cloyde Reams, MD Cell [phone]    03/21/2017 11:46 AM

## 2017-03-21 NOTE — H&P (Signed)
Date: 03/21/2017               Patient Name:  [name] MRN: [id number]  DOB: [birth year] Age / Sex: 21 y.o., male   PCP: Kaleen Mask, MD              Medical Service: Internal Medicine Teaching Service              Attending Physician: Dr. Oswaldo Done, Marquita Palms, *    First Contact: Clare Charon, MS 3    Second Contact: Dr. Nelson Chimes    Third Contact Dr.  Agnes Lawrence         After Hours (After 5p/  First Contact Pager: [phone]  weekends / holidays): Second Contact Pager: [phone]   Chief Complaint: Swollen and red right arm with numbness in his hands      History of Present Illness: [name] is a 21 yo male with a history of IV drug use, hypothyroidism, depression, eczema, and multiple abscesses who presents to the ED with a painful, red, swollen right arm.  The patient states that he has been injecting suboxone (that he got off of the street) for the past 6 months in order to try and wean himself off of opioids.  Two days ago he injected suboxone and woke up the next morning with some pain in his right arm below the injection site.  He then took some left over bactrim from his grandfather which caused him to break out in hives.  His pain continued to worsen and so he came to the ED today:  In the ED, he reports, pain, swelling, and  numbness In his hands and fingers, most prominently in his 1st-3rd digits.  He also complains of a growing headache behind his eyes that has been going since his arm pain. He also mentioned that he has had multiple scratches from new kittens at home, and often gets scratches on his arms and legs from going out in the woods.  He also notes that he has pain in his knees, mostly in his left knee, that he attributes to them being "banged up" as a child.   He denies any SOB, chest pain, constipation, or dysuria, or any discharge from his urethra.    The patient reports previous attempts to wean himself off of opioids via a suboxone clinic that he  found ineffective.  He does not report any signs of withdrawal at the moment.     Meds: No current facility-administered medications for this encounter.    Current Outpatient Prescriptions  Medication Sig Dispense Refill  . ibuprofen (ADVIL,MOTRIN) 200 MG tablet Take 200-600 mg by mouth every 6 (six) hours as needed for moderate pain.    Marland Kitchen levothyroxine (SYNTHROID, LEVOTHROID) 175 MCG tablet Take 175 mcg by mouth daily before breakfast.       Allergies: Allergies as of 03/21/2017 - Review Complete 08/22/2015  Allergen Reaction Noted  . Tylenol [acetaminophen] Other (See Comments) 08/22/2015  . Sulfa antibiotics Rash 04/28/2011   Past Medical History:  Diagnosis Date  . Depression   . Migraines   . Thyroid disease    Past Surgical History:  Procedure Laterality Date  . I&D EXTREMITY Left 01/27/2014   Procedure: IRRIGATION AND DEBRIDEMENT EXTREMITY LEFT WRIST;  Surgeon: Tami Ribas, MD;  Location: [facility] OR;  Service: Orthopedics;  Laterality: Left;   No family history on file. Social History   Social History  . Marital status: Single    Spouse name:  N/A  . Number of children: N/A  . Years of education: N/A   Occupational History  . Not on file.   Social History Main Topics  . Smoking status: Current Every Day Smoker  . Smokeless tobacco: Never Used  . Alcohol use No  . Drug use: No  . Sexual activity: Not on file   Other Topics Concern  . Not on file   Social History Narrative  . No narrative on file    Review of Systems: Pertinent items noted in HPI and remainder of comprehensive ROS otherwise negative.  Physical Exam: Blood pressure 121/67, pulse 81, temperature 98.4 F (36.9 C), temperature source Oral, resp. rate 18, SpO2 96 %. General appearance: distracted and mild distress Lungs: clear to auscultation bilaterally Heart: regular rate and rhythm, S1, S2 normal, no murmur, click, rub or gallop Abdomen: soft, non-tender; bowel sounds normal; no masses,   no organomegaly Extremities: multiple scratches and abrasians on both forearms and both calves.  Right forearm, wrist, and hand are red and swollen.  Pulses: 2+ and symmetric Skin: erythema - arm(s) right Neurologic: Sensory: normal, Numbness and tingling in right arm, particularly in digits 1-3 Motor: motor function normal everyone, including right arm  Lab results: CMP Latest Ref Rng & Units 03/21/2017 08/28/2014 01/27/2014  Glucose 65 - 99 mg/dL 409(W148(H) 88 77  BUN 6 - 20 mg/dL 15 11 13   Creatinine 0.61 - 1.24 mg/dL 1.19(J1.31(H) 4.780.80 2.950.73  Sodium 135 - 145 mmol/L 133(L) 139 140  Potassium 3.5 - 5.1 mmol/L 4.7 4.4 4.1  Chloride 101 - 111 mmol/L 100(L) 102 102  CO2 22 - 32 mmol/L 20(L) - 25  Calcium 8.9 - 10.3 mg/dL 6.2(Z8.8(L) - 9.2  Total Protein 6.5 - 8.1 g/dL 7.0 - -  Total Bilirubin 0.3 - 1.2 mg/dL 1.0 - -  Alkaline Phos 38 - 126 U/L 65 - -  AST 15 - 41 U/L 37 - -  ALT 17 - 63 U/L 22 - -   CBC Latest Ref Rng & Units 03/21/2017 08/28/2014 08/28/2014  WBC 4.0 - 10.5 K/uL 10.0 - 10.4  Hemoglobin 13.0 - 17.0 g/dL 30.816.1 65.715.6 84.614.3  Hematocrit 39.0 - 52.0 % 46.7 46.0 43.1  Platelets 150 - 400 K/uL 173 - 279   Urinalysis    Component Value Date/Time   COLORURINE YELLOW 03/21/2017 0735   APPEARANCEUR CLEAR 03/21/2017 0735   LABSPEC 1.020 03/21/2017 0735   PHURINE 6.0 03/21/2017 0735   GLUCOSEU NEGATIVE 03/21/2017 0735   HGBUR NEGATIVE 03/21/2017 0735   BILIRUBINUR NEGATIVE 03/21/2017 0735   KETONESUR NEGATIVE 03/21/2017 0735   PROTEINUR NEGATIVE 03/21/2017 0735   UROBILINOGEN 1.0 06/01/2011 0007   NITRITE NEGATIVE 03/21/2017 0735   LEUKOCYTESUR NEGATIVE 03/21/2017 0735    Imaging results:  No results found.  Assessment & Plan by Problem: Active Problems:   Cellulitis  Right arm swelling: The patient has acute arm swelling and redness below the elbow, and numbness in his 1-3rd digits as well as some diffuse tingling in his arm.  There are no motor deficits in his arm.  Based on  these symptoms he likely has compression of the median nerve which could either be caused by swelling from cellulitis, or possibly compartment syndrome caused by a venous clot produced from saboxone injections.   The patient's retention of motor function, as well as a lack of significant pain on flexion of the arm, wrist and digits, makes me think that compartment syndrome is less likely and that there  is compression of the median nerve at the wrist from cellulitis.   Plan: -get US/doppler of forearm to rule out compression syndrome or any clot etiology -Was started on clindo in the ED, will now start on cefazolin for cellulitis -treat pain with  tramadol 50mg  PRN, benedryl PRN, suboxone with be administered to prevent withdrawl  IV drug use: The patient reports many years of IV drug use (though how many was unclear) and a recent attempt to wean himself off of opioids with suboxone.  He reports that previous attempts with a suboxone clinic were unsuccessful, so he has been getting suboxone off of the street in order to treat himself.  We encourage proper administration of suboxone in a clinic rather than self administration, and discussed symptoms of withdrawal. He currently has a headache which may be an early sign.  Plan: -administer inpatient suboxone -treat withdrawal symptomatically  -HIV and Hep panel ordered due to risk associated with IV drug use -URD to conform no active drugs in system  Knee Pain: The patient reports knee pain, mre prominent on his right that he attributes to banging them up as a child.   Plan:  -treat with tramadol 50mg  PRN  Hypothyroidism: The patient takes synthroid but is asymptomatic at this time. He has a long family history of hypothyroidism Plan: -Send for TSH, though with the understanding that his acute pain right now may mean this test is useless.  -continue 175 mcg synthroid  This is a Psychologist, occupational Note.  The care of the patient was discussed with  Dr. Bennett Scrape and the assessment and plan was formulated with their assistance.  Please see their note for official documentation of the patient encounter.   Signed: Ames Coupe, Medical Student 03/21/2017, 9:33 AM

## 2017-03-21 NOTE — H&P (Signed)
Date: 03/21/2017               Patient Name:  [name] MRN: [id number]  DOB: 1996-02-14 Age / Sex: 21 y.o., male   PCP: Kaleen MaskElkins, Wilson Oliver, MD         Medical Service: Internal Medicine Teaching Service         Attending Physician: Dr. Oswaldo DoneVincent, Marquita Palmsuncan Thomas, *    First Contact: Dr. Nelson ChimesAmin Pager: [phone]  Second Contact: Dr. Loney Lohathore Pager: [phone]       After Hours (After 5p/  First Contact Pager: [phone]  weekends / holidays): Second Contact Pager: [phone]   Chief Complaint: Right arm pain and swelling.  History of Present Illness: Patient is a 21 year old man with history of IV drug abuse came to ED with complaints of worsening swelling and erythema of right arm which started after injecting Suboxone 2 days ago along with tingling and numbness of right hand.  Patient has an history of daily injecting opioids for last 3 years, recently trying to wean himself off by using Suboxone which he gets from the street, mostly inject Suboxone stating that it has better effect. He injected on his right arm on Thursday,  developed swelling and worsening pain few hours later-next day in the morning he took one Bactrim from his grandfathers prescription and developed hives and rash. He also complained of nausea and vomiting yesterday, denies any fever or chills, denies any chest pain, palpitations, diaphoresis. He denies any diarrhea but does endorse occasional constipation. He denies any dysuria, hematuria or any other penile discharge. He still complained of bilateral knee pain increased with leg flexion, denies any edema or erythema or any recent injury. According to patient this is a chronic issue and going on for many years.  In ED was afebrile, no leukocytosis with erythema and edema of right medial arm and mild edema of right hand.  Meds:  Current Meds  Medication Sig  . ibuprofen (ADVIL,MOTRIN) 200 MG tablet Take 200-600 mg by mouth every 6 (six) hours as needed for  moderate pain.  Marland Kitchen. levothyroxine (SYNTHROID, LEVOTHROID) 175 MCG tablet Take 175 mcg by mouth daily before breakfast.     Allergies: Allergies as of 03/21/2017 - Review Complete 08/22/2015  Allergen Reaction Noted  . Tylenol [acetaminophen] Other (See Comments) 08/22/2015  . Sulfa antibiotics Rash 04/28/2011   Past Medical History:  Diagnosis Date  . Depression   . Migraines   . Thyroid disease     Family History: Father has diabetes.  Social History: Current smoker of both cigarettes and pot. Uses IV opioids daily. Dropped out of high school in 10th grade.  Review of Systems: A complete ROS was negative except as per HPI.   Physical Exam: Blood pressure 121/67, pulse 81, temperature 98.4 F (36.9 C), temperature source Oral, resp. rate 18, SpO2 96 %. Vitals:   03/21/17 0523 03/21/17 0737 03/21/17 0745  BP: (!) 109/59 (!) 98/46 121/67  Pulse: 70 70 81  Resp: 16 18   Temp: 98.4 F (36.9 C)    TempSrc: Oral    SpO2: 99% 94% 96%   General: Vital signs reviewed.  Patient is well-developed and well-nourished, in no acute distress and cooperative with exam.  Head: Normocephalic and atraumatic. Eyes: EOMI, conjunctivae normal, no scleral icterus.  Neck: Supple, trachea midline, normal ROM, no JVD, masses, thyromegaly, or carotid bruit present.  Cardiovascular: RRR, S1 normal, S2 normal, no murmurs, gallops, or rubs. Pulmonary/Chest: Clear to auscultation  bilaterally, no wheezes, rales, or rhonchi. Abdominal: Soft, non-tender, non-distended, BS +, no masses, organomegaly, or guarding present.  Musculoskeletal: No joint deformities, erythema, or stiffness, ROM full and nontender. Extremities: Right upper extremity erythema and tense edema around the biceps area , mild edema of right hand , extremity was warm with intact pulses. There were multiple excoriation and scratch marks on both upper and lower extremities. There were IV track marks around the elbow bilaterally.  No lower  extremity edema bilaterally,  pulses symmetric and intact bilaterally. No cyanosis or clubbing. Neurological: A&O x3, Strength is normal and symmetric bilaterally, cranial nerve II-XII are grossly intact, no focal motor deficit, sensory intact to light touch bilaterally.  Psychiatric: Flat affect.  Labs. CBC    Component Value Date/Time   WBC 10.0 03/21/2017 0549   RBC 5.34 03/21/2017 0549   HGB 16.1 03/21/2017 0549   HCT 46.7 03/21/2017 0549   PLT 173 03/21/2017 0549   MCV 87.5 03/21/2017 0549   MCH 30.1 03/21/2017 0549   MCHC 34.5 03/21/2017 0549   RDW 13.1 03/21/2017 0549   LYMPHSABS 0.3 (L) 03/21/2017 0549   MONOABS 0.4 03/21/2017 0549   EOSABS 0.3 03/21/2017 0549   BASOSABS 0.0 03/21/2017 0549   CMP     Component Value Date/Time   NA 133 (L) 03/21/2017 0549   K 4.7 03/21/2017 0549   CL 100 (L) 03/21/2017 0549   CO2 20 (L) 03/21/2017 0549   GLUCOSE 148 (H) 03/21/2017 0549   BUN 15 03/21/2017 0549   CREATININE 1.31 (H) 03/21/2017 0549   CALCIUM 8.8 (L) 03/21/2017 0549   PROT 7.0 03/21/2017 0549   ALBUMIN 4.3 03/21/2017 0549   AST 37 03/21/2017 0549   ALT 22 03/21/2017 0549   ALKPHOS 65 03/21/2017 0549   BILITOT 1.0 03/21/2017 0549   GFRNONAA >60 03/21/2017 0549   GFRAA >60 03/21/2017 0549   Urinalysis    Component Value Date/Time   COLORURINE YELLOW 03/21/2017 0735   APPEARANCEUR CLEAR 03/21/2017 0735   LABSPEC 1.020 03/21/2017 0735   PHURINE 6.0 03/21/2017 0735   GLUCOSEU NEGATIVE 03/21/2017 0735   HGBUR NEGATIVE 03/21/2017 0735   BILIRUBINUR NEGATIVE 03/21/2017 0735   KETONESUR NEGATIVE 03/21/2017 0735   PROTEINUR NEGATIVE 03/21/2017 0735   UROBILINOGEN 1.0 06/01/2011 0007   NITRITE NEGATIVE 03/21/2017 0735   LEUKOCYTESUR NEGATIVE 03/21/2017 0735    Assessment & Plan by Problem: Patient is a 21 year old man with history of IV drug abuse came to ED with complaints of worsening swelling and erythema of right arm which started after injecting Suboxone 2  days ago along with tingling and numbness of right hand.  Right upper extremity swelling. His differential include  1. Cellulitis. Because of his IV drug abuse history. He does not exhibit signs of infection including no fever and no leukocytosis currently. He got 1 dose of IV clindamycin and ED. -Ancef IV. 2. Thrombosis of right upper extremity. Injecting Suboxone can predispose thrombosis including superficial and deep. -Right upper extremity Doppler to rule out any thrombosis. 2. Compartment syndrome. Can be another possibility because of tense edema around his right biceps area and tingling and numbness of his fingers. -Elevate right upper extremity. -Orthopedic consult. -Keep monitoring for any worsening symptoms.  H/O of Opiate abuse. He was trying to wean him off from his daily opiate use by getting Suboxone from the street. He occasionally take it orally and sometimes inject himself stating that injecting help him better. -HIV antibody. -Hep. C antibody. -Hep B  antigen and antibody. -Suboxone 8 mg twice a day. -He will get help from Suboxone clinic on discharge.  Code Status. Full Diet. Regular  Dispo: Admit patient to Observation with expected length of stay less than 2 midnights.  SignedArnetha Courser, MD 03/21/2017, 8:01 AM  Pager: [phone]

## 2017-03-21 NOTE — ED Notes (Signed)
Pt now sitting quietly with mother in waiting room.

## 2017-03-21 NOTE — ED Notes (Addendum)
Pt reports right arm pain that began yesterday. Pt reports that his finger tips are knumb and tingling. 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 Pt also reports taking an antibiotic that is his fathers but he is allergic to same. Pt has red arms with some track marks to same.

## 2017-03-21 NOTE — ED Notes (Signed)
IV retaped,-- instructed pt to be careful when moving arm and moving around in bed.

## 2017-03-22 DIAGNOSIS — L03113 Cellulitis of right upper limb: Secondary | ICD-10-CM | POA: Diagnosis not present

## 2017-03-22 DIAGNOSIS — F1110 Opioid abuse, uncomplicated: Secondary | ICD-10-CM

## 2017-03-22 DIAGNOSIS — M02362 Reiter's disease, left knee: Secondary | ICD-10-CM | POA: Diagnosis not present

## 2017-03-22 DIAGNOSIS — N179 Acute kidney failure, unspecified: Secondary | ICD-10-CM | POA: Diagnosis not present

## 2017-03-22 DIAGNOSIS — Z886 Allergy status to analgesic agent status: Secondary | ICD-10-CM

## 2017-03-22 DIAGNOSIS — M02361 Reiter's disease, right knee: Secondary | ICD-10-CM

## 2017-03-22 DIAGNOSIS — Z882 Allergy status to sulfonamides status: Secondary | ICD-10-CM

## 2017-03-22 DIAGNOSIS — F1120 Opioid dependence, uncomplicated: Secondary | ICD-10-CM | POA: Diagnosis not present

## 2017-03-22 DIAGNOSIS — R202 Paresthesia of skin: Secondary | ICD-10-CM | POA: Diagnosis not present

## 2017-03-22 DIAGNOSIS — F12120 Cannabis abuse with intoxication, uncomplicated: Secondary | ICD-10-CM | POA: Diagnosis not present

## 2017-03-22 DIAGNOSIS — F17210 Nicotine dependence, cigarettes, uncomplicated: Secondary | ICD-10-CM | POA: Diagnosis not present

## 2017-03-22 DIAGNOSIS — M7989 Other specified soft tissue disorders: Secondary | ICD-10-CM | POA: Diagnosis not present

## 2017-03-22 DIAGNOSIS — M25461 Effusion, right knee: Secondary | ICD-10-CM | POA: Diagnosis not present

## 2017-03-22 DIAGNOSIS — B9689 Other specified bacterial agents as the cause of diseases classified elsewhere: Secondary | ICD-10-CM

## 2017-03-22 DIAGNOSIS — Z833 Family history of diabetes mellitus: Secondary | ICD-10-CM | POA: Diagnosis not present

## 2017-03-22 LAB — SYNOVIAL CELL COUNT + DIFF, W/ CRYSTALS
CRYSTALS FLUID: NONE SEEN
Eosinophils-Synovial: 1 % (ref 0–1)
MONOCYTE-MACROPHAGE-SYNOVIAL FLUID: 80 % (ref 50–90)
NEUTROPHIL, SYNOVIAL: 19 % (ref 0–25)
WBC, SYNOVIAL: 2210 /mm3 — AB (ref 0–200)

## 2017-03-22 LAB — GRAM STAIN

## 2017-03-22 MED ORDER — PNEUMOCOCCAL VAC POLYVALENT 25 MCG/0.5ML IJ INJ: 0.5000 mL | INJECTION | INTRAMUSCULAR | Status: DC

## 2017-03-22 MED ORDER — BUPRENORPHINE HCL-NALOXONE HCL 8-2 MG SL SUBL: 1.0000 | SUBLINGUAL_TABLET | Freq: Two times a day (BID) | SUBLINGUAL | Status: DC

## 2017-03-22 MED ORDER — DOXYCYCLINE HYCLATE 100 MG PO CAPS: 100.0000 mg | ORAL_CAPSULE | Freq: Two times a day (BID) | ORAL | 0 refills | Status: AC

## 2017-03-22 NOTE — Discharge Summary (Signed)
Name: [name] MRN: [id number] DOB: 1996/04/16 21 y.o. PCP: Kaleen MaskElkins, Wilson Oliver, MD  Date of Admission: 03/21/2017  5:44 AM Date of Discharge: 03/22/2017 Attending Physician: [name], Duncan Thomas,   Discharge Diagnosis: 1. Right arm cellulitis. 2. Reactive arthritis of knees.  Active Problems:   Cellulitis   Discharge Medications: Allergies as of 03/22/2017      Reactions   Tylenol [acetaminophen] Other (See Comments)   "Per pt Hurts my stomach"   Sulfa Antibiotics Rash      Medication List    TAKE these medications   buprenorphine-naloxone 8-2 mg Subl SL tablet Commonly known as:  SUBOXONE Place 1 tablet under the tongue 2 (two) times daily.   doxycycline 100 MG capsule Commonly known as:  VIBRAMYCIN Take 1 capsule (100 mg total) by mouth 2 (two) times daily.   ibuprofen 200 MG tablet Commonly known as:  ADVIL,MOTRIN Take 200-600 mg by mouth every 6 (six) hours as needed for moderate pain.   levothyroxine 175 MCG tablet Commonly known as:  SYNTHROID, LEVOTHROID Take 175 mcg by mouth daily before breakfast.       Disposition and follow-up:   [name] was discharged from [facility] in Good condition.  At the hospital follow up visit please address:  1.  Please make sure that he is taking his antibiotics as directed and his symptoms are improving. He is here for Suboxone  refill-need to be seen either by Dr. Burton ApleyMullin or Dr. Oswaldo DoneVincent. -Counsel him again for illicit drug cessation, including opioids, marijuana and smoking.  2.  Labs / imaging needed at time of follow-up: BMP, Base line HIV, Hep C and B.  3.  Pending labs/ test needing follow-up: Joint fluid culture results.  Follow-up Appointments: Follow-up Information    [facility]. Call.   Why:  Please call and make an appointment to be seen with them next week for your Suboxone and refill. Contact information: [address] [place]  [place] [id number] [phone]          Hospital Course by problem list:  Patient is a 21 year old manwith history of IV drug abuse came to ED with complaints of worsening swelling and erythema of right arm which started after injecting Suboxone 2 days ago along with tingling and numbness of right hand.  Right upper extremity cellulitis. On presentation patient was afebrile with no significant leukocytosis, he was having marked tense edema around his biceps area with multiple track marks due to prior injections. Past Suboxone can cause thromboembolism-we obtained a venous Doppler to rule out any thrombosis and it was negative. Orthopedic surgery was consulted with the fear of any compartment syndrome because of his complaint of tingling and numbness of his hand, his hand was warm on presentation and pulses were intact. Orthopedic recommended just elevation and symptomatic treatment. He was given a dose of clindamycin IV in ED and later started on Ancef. His pain and swelling responded very well, his hand numbness improved although still having mild tingling in his right thumb, index and ring finger. Next morning he was complaining of worsening pain and swelling in his knees.  Bilateral knee painful swelling. Next he was complaining of worsening pain and swelling of his both knee, right worse than left. On exam there were erythema, edema and mild hyperthermia of both knees, right was with more confusion as compared to left. Later he had a right knee diagnostic arthrocentesis performed. Synovial fluid shows white  cell count little above 2000 with no crystals. The results are more consistent with reactive arthritis. He was discharged home with doxycycline for 10 days. Synivial fluid culture results are pending.  AKI. On presentation his creatinine was 1.31, most likely because of dehydration as he was unable to tolerate by mouth intake due to vomiting. Later during his stay he was giving IV  fluids and was able to tolerate by mouth intake very well. We were unable to repeat BMP as he was hard stick and have multiple failed attempts for blood draw.  H/O of Opiateabuse. He has history of daily opioid use for last 3 years. He dropped out of his high school in 10th grade. Recently he realized his mistakes and wants to quit opioid. He was given a prescription of Suboxone for 10 days because of his Medicaid. Later he started getting Suboxone over the street, as he was clearly under treated, he started injecting Suboxone to get a better effect as advised by some friend. We had a long discussion regarding his addiction of opiate and marijuana both and the helpful we can provide him if he is willing to accept it. Patient seems motivated and wants to correct his mistakes. He was provided with Suboxone 8 mg twice daily during his stay and shows no signs of withdrawal. He was given a prescription of Suboxone for 1 week. He was also to follow up at internal medicine clinic with Dr. Criselda Peaches or Dr. Oswaldo Done for further management. We offered him to provide some assistance with the cost of Suboxone.  We were unable to obtain blood for health maintenance purposes to check for his HIV, hep B and hep C status because of his IV drug abuse history, as he was hard stick and having cellulitis of one limb. He will need these test during his follow-up in clinic.  Discharge Vitals:   BP (!) 117/45 (BP Location: Left Arm)   Pulse 88   Temp 98.9 F (37.2 C) (Oral)   Resp 18   Ht 5\' 11"  (1.803 m)   Wt 126 lb (57.2 kg)   SpO2 97%   BMI 17.57 kg/m   Gen. well developed young guy, in no acute distress. Musculoskeletal. Bilateral edema and erythema of both knees right worse than left. Restricted range of motion due to pain. Mild hyperthermia over knees. Lungs. Clear bilaterally. CV. Regular rate and rhythm Extremities. Edema and mild erythema of right arm, improved as compared to yesterday. Multiple  excoriation marks bilaterally on upper and lower extremities. Pulses 2+ bilaterally.  Pertinent Labs, Studies, and Procedures:  CBC    Component Value Date/Time   WBC 10.0 03/21/2017 0549   RBC 5.34 03/21/2017 0549   HGB 16.1 03/21/2017 0549   HCT 46.7 03/21/2017 0549   PLT 173 03/21/2017 0549   MCV 87.5 03/21/2017 0549   MCH 30.1 03/21/2017 0549   MCHC 34.5 03/21/2017 0549   RDW 13.1 03/21/2017 0549   LYMPHSABS 0.3 (L) 03/21/2017 0549   MONOABS 0.4 03/21/2017 0549   EOSABS 0.3 03/21/2017 0549   BASOSABS 0.0 03/21/2017 0549   CMP     Component Value Date/Time   NA 133 (L) 03/21/2017 0549   K 4.7 03/21/2017 0549   CL 100 (L) 03/21/2017 0549   CO2 20 (L) 03/21/2017 0549   GLUCOSE 148 (H) 03/21/2017 0549   BUN 15 03/21/2017 0549   CREATININE 1.31 (H) 03/21/2017 0549   CALCIUM 8.8 (L) 03/21/2017 0549   PROT 7.0 03/21/2017 0549  ALBUMIN 4.3 03/21/2017 0549   AST 37 03/21/2017 0549   ALT 22 03/21/2017 0549   ALKPHOS 65 03/21/2017 0549   BILITOT 1.0 03/21/2017 0549   GFRNONAA >60 03/21/2017 0549   GFRAA >60 03/21/2017 0549   Urinalysis    Component Value Date/Time   COLORURINE YELLOW 03/21/2017 0735   APPEARANCEUR CLEAR 03/21/2017 0735   LABSPEC 1.020 03/21/2017 0735   PHURINE 6.0 03/21/2017 0735   GLUCOSEU NEGATIVE 03/21/2017 0735   HGBUR NEGATIVE 03/21/2017 0735   BILIRUBINUR NEGATIVE 03/21/2017 0735   KETONESUR NEGATIVE 03/21/2017 0735   PROTEINUR NEGATIVE 03/21/2017 0735   UROBILINOGEN 1.0 06/01/2011 0007   NITRITE NEGATIVE 03/21/2017 0735   LEUKOCYTESUR NEGATIVE 03/21/2017 0735   Drugs of Abuse     Component Value Date/Time   LABOPIA NONE DETECTED 03/21/2017 0735   COCAINSCRNUR NONE DETECTED 03/21/2017 0735   LABBENZ POSITIVE (A) 03/21/2017 0735   AMPHETMU NONE DETECTED 03/21/2017 0735   THCU POSITIVE (A) 03/21/2017 0735   LABBARB NONE DETECTED 03/21/2017 0735    Culture, blood (routine x 2)  Order: [id number]  Status:  Preliminary result Visible  to patient:  No (Not Released) Next appt:  None  Newer results are available. Click to view them now.   2d ago  Specimen Description BLOOD LEFT ARM   Special Requests Blood Culture results may not be optimal due to an inadequate volume of blood received in culture bottles     Culture NO GROWTH < 24 HOURS        Synovial cell count + diff, w/ crystals  Order: [id number]  Status:  Final result Visible to patient:  No (Not Released) Next appt:  None   Ref Range & Units 1d ago  Color, Synovial YELLOW YELLOW   Appearance-Synovial CLEAR HAZY    Crystals, Fluid  NO CRYSTALS SEEN   WBC, Synovial 0 - 200 /cu mm 2,210    Neutrophil, Synovial 0 - 25 % 19   Monocyte-Macrophage-Synovial Fluid 50 - 90 % 80   Eosinophils-Synovial 0 - 1 % 1        Gram stain  Order: [id number]  Status:  Final result Visible to patient:  No (Not Released) Next appt:  None   1d ago  Specimen Description FLUID SYNOVIAL   Special Requests NONE   Gram Stain WBC PRESENT, PREDOMINANTLY MONONUCLEAR  WBC PRESENT, PREDOMINANTLY PMN  NO ORGANISMS SEEN      Report Status 03/22/2017 FINAL        Right upper extremity Doppler. Summary: No evidence of deep vein or superficial thrombosis involving the left upper extremity.  Discharge Instructions: Discharge Instructions    Diet - low sodium heart healthy    Complete by:  As directed    Discharge instructions    Complete by:  As directed    It was pleasure taking care of you. Please take your antibiotics as directed. You are also given a prescription for Suboxone for 1 week. Please call internal medicine Center and make an appointment to be seen within next week to renew your Suboxone. Please take it slowly on your knees and increase your activity as tolerated.   Increase activity slowly    Complete by:  As directed       Signed: Arnetha Courser, MD 03/23/2017, 11:22 AM   Pager: [phone]

## 2017-03-22 NOTE — Procedures (Signed)
Date: 03/22/2017 at 11:20 am   Procedure: Ultrasound guided Right knee arthrocentesis   Indication: Right knee pain and swelling secondary to joint effusion   After consent was obtained, using sterile technique the right knee was prepped and 5 ml of plain Lidocaine 1% was injected with a 24 G needle as a local anesthetic. The joint was next entered with a 18 G needle in the medial superior position, 40 ml of yellow appearing synovial fluid was aspirated and the needle withdrawn. The aspirated fluid was sent for cell count, culture, and crystal analysis. Patient tolerated the procedure well with no immediate complications. The patient is asked to continue to rest the joint for a few more days before resuming regular activities. Advised him to watch for fever, increased swelling, or persistent pain in the joint. Advised patient to seek immediate medical attention if such symptoms occur or there is failure to improve.

## 2017-03-22 NOTE — Progress Notes (Signed)
   Subjective: Patient was complaining of swollen and painful knees when seen this morning. His right arm swelling and pain has improved. Still having mild numbness of the thumb, index and middle finger but that is also improving according to him. No fever or chills.  Objective:  Vital signs in last 24 hours: Vitals:   03/21/17 1126 03/21/17 1720 03/21/17 2313 03/22/17 0633  BP: (!) 122/46 (!) 111/57 (!) 105/42 (!) 117/45  Pulse: 88 90 90 88  Resp: 18 17 18 18   Temp: 98.3 F (36.8 C) 99.4 F (37.4 C) 99.2 F (37.3 C) 98.9 F (37.2 C)  TempSrc: Oral Oral Oral Oral  SpO2: 100% 99% 100% 97%  Weight:      Height:       Gen. well developed young guy, in no acute distress. Musculoskeletal. Bilateral edema and erythema of both knees right worse than left. Restricted range of motion due to pain. Mild hyperthermia over knees. Lungs. Clear bilaterally. CV. Regular rate and rhythm Extremities. Edema and mild erythema of right arm, improved as compared to yesterday. Multiple excoriation marks bilaterally on upper and lower extremities. Pulses 2+ bilaterally.  Assessment/Plan:  Patient is a 21 year old man with history of IV drug abuse came to ED with complaints of worsening swelling and erythema of right arm which started after injecting Suboxone 2 days ago along with tingling and numbness of right hand.  Right upper extremity cellulitis. Most likely the cause of his presentation. Doppler studies were negative for any thrombosis. His swelling and numbness is improving. -Blood culture results still pending. -Continue cefazolin.  Bilateral knee painful swelling. He developed bilateral painful swelling of his knees right worse than left. It can be either due to a reactive arthritis or septic arthritis because of his IV drug abuse. -We will perform arthrocentesis and send fluid for evaluation. -If he has positive blood cultures we will evaluate him for endocarditis.  H/O of Opiate abuse.  No sign of withdrawal. -Continue Suboxone 8 mg twice daily.  Dispo: Anticipated discharge in approximately 1 day(s).   Arnetha CourserAmin, Janielle Mittelstadt, MD 03/22/2017, 9:03 AM Pager: [phone]

## 2017-03-22 NOTE — Progress Notes (Signed)
Patient was complaining of pain on his knees. His knees are today swollen, red and very warm to touch. MD was informed. Will continue to monitor.

## 2017-03-22 NOTE — Discharge Instructions (Signed)
Knee Arthrocentesis, Care After Refer to this sheet in the next few weeks. These instructions provide you with information about caring for yourself after your procedure. Your health care provider may also give you more specific instructions. Your treatment has been planned according to current medical practices, but problems sometimes occur. Call your health care provider if you have any problems or questions after your procedure. What can I expect after the procedure? After the procedure, it is common to have:  Bruising.  Swelling.  Soreness. Follow these instructions at home: Bathing   Keep the bandage (dressing) dry until your health care provider says it can be removed. Ask your health care provider when you can start showering or taking a bath. Managing pain, stiffness, and swelling   If directed, apply ice to the puncture area:  Put ice in a plastic bag.  Place a towel between your skin and the bag.  Leave the ice on for 20 minutes, 2-3 times per day.  Do not apply heat to your knee.  Raise the puncture area above the level of your heart while you are sitting or lying down. Activity   Avoid strenuous activities for as long as directed by your health care provider. Ask your health care provider when you can return to your normal activities. General instructions   Take medicines only as directed by your health care provider.  Do not take aspirin or other over-the-counter medicines unless your health care provider says you can.  Check your puncture site every day for signs of infection. Watch for:  Redness, swelling, or pain.  Fluid, blood, or pus.  Follow your health care providers instructions about dressing changes and removal. Contact a health care provider if:  Your knee pain, swelling, or stiffness returns or gets worse.  You have redness, swelling, or pain in your puncture area.  You have fluid, blood, or pus coming from your puncture site.  You have  warmth in your puncture area.  You have a fever.  Your pain is not controlled with medicine. This information is not intended to replace advice given to you by your health care provider. Make sure you discuss any questions you have with your health care provider. Document Released: 11/17/2014 Document Revised: 04/03/2016 Document Reviewed: 09/06/2014 Elsevier Interactive Patient Education  2017 [employer] Inc[employer]   Cellulitis, Adult Cellulitis is a skin infection. The infected area is usually red and sore. This condition occurs most often in the arms and lower legs. It is very important to get treated for this condition. Follow these instructions at home:  Take over-the-counter and prescription medicines only as told by your doctor.  If you were prescribed an antibiotic medicine, take it as told by your doctor. Do not stop taking the antibiotic even if you start to feel better.  Drink enough fluid to keep your pee (urine) clear or pale yellow.  Do not touch or rub the infected area.  Raise (elevate) the infected area above the level of your heart while you are sitting or lying down.  Place warm or cold wet cloths (warm or cold compresses) on the infected area. Do this as told by your doctor.  Keep all follow-up visits as told by your doctor. This is important. These visits let your doctor make sure your infection is not getting worse. Contact a doctor if:  You have a fever.  Your symptoms do not get better after 1-2 days of treatment.  Your bone or joint under the infected area starts  to hurt after the skin has healed.  Your infection comes back. This can happen in the same area or another area.  You have a swollen bump in the infected area.  You have new symptoms.  You feel ill and also have muscle aches and pains. Get help right away if:  Your symptoms get worse.  You feel very sleepy.  You throw up (vomit) or have watery poop (diarrhea) for a long time.  There are red  streaks coming from the infected area.  Your red area gets larger.  Your red area turns darker. This information is not intended to replace advice given to you by your health care provider. Make sure you discuss any questions you have with your health care provider. Document Released: 04/14/2008 Document Revised: 04/03/2016 Document Reviewed: 09/05/2015 Elsevier Interactive Patient Education  2017 [employer].

## 2017-03-22 NOTE — Progress Notes (Signed)
Patient was discharged home by MD order; discharged instructions  review and give to patient and his mom with care notes and prescription; IV DIC; mother was educated about the prescription for Suboxone, how to administer to her son and also, the need to see the doctor in about one week for refill; mom asked to take the patient out by herself.

## 2017-03-25 ENCOUNTER — Encounter: Payer: Self-pay | Admitting: Student in an Organized Health Care Education/Training Program

## 2017-03-25 DIAGNOSIS — F1120 Opioid dependence, uncomplicated: Secondary | ICD-10-CM | POA: Insufficient documentation

## 2017-03-26 LAB — CULTURE, BLOOD (ROUTINE X 2): Culture: NO GROWTH

## 2017-03-27 LAB — CULTURE, BODY FLUID W GRAM STAIN -BOTTLE: Culture: NO GROWTH

## 2017-03-27 LAB — CULTURE, BODY FLUID-BOTTLE

## 2017-03-31 ENCOUNTER — Ambulatory Visit (INDEPENDENT_AMBULATORY_CARE_PROVIDER_SITE_OTHER): Payer: Medicaid Other | Admitting: Internal Medicine

## 2017-03-31 VITALS — BP 103/45 | HR 62 | Temp 98.3°F | Ht 71.0 in | Wt 139.5 lb

## 2017-03-31 DIAGNOSIS — Z813 Family history of other psychoactive substance abuse and dependence: Secondary | ICD-10-CM

## 2017-03-31 DIAGNOSIS — F1120 Opioid dependence, uncomplicated: Secondary | ICD-10-CM

## 2017-03-31 DIAGNOSIS — E038 Other specified hypothyroidism: Secondary | ICD-10-CM

## 2017-03-31 DIAGNOSIS — L03113 Cellulitis of right upper limb: Secondary | ICD-10-CM | POA: Diagnosis not present

## 2017-03-31 DIAGNOSIS — E063 Autoimmune thyroiditis: Secondary | ICD-10-CM | POA: Diagnosis not present

## 2017-03-31 DIAGNOSIS — Z5189 Encounter for other specified aftercare: Secondary | ICD-10-CM | POA: Diagnosis present

## 2017-03-31 DIAGNOSIS — F17210 Nicotine dependence, cigarettes, uncomplicated: Secondary | ICD-10-CM | POA: Diagnosis not present

## 2017-03-31 DIAGNOSIS — Z833 Family history of diabetes mellitus: Secondary | ICD-10-CM

## 2017-03-31 MED ORDER — BUPRENORPHINE HCL-NALOXONE HCL 8-2 MG SL SUBL: 2.0000 | SUBLINGUAL_TABLET | Freq: Every day | SUBLINGUAL | 0 refills | Status: DC

## 2017-03-31 NOTE — Progress Notes (Addendum)
   CC: Cellulitis, opioid use disorder   HPI:  [name] is a 21 y.o. male with past medical history outlined below here for follow up of cellulitis and opioid use disorder. For the details of today's visit, please refer to the assessment and plan.  Past Medical History:  Diagnosis Date  . Depression   . Migraines   . Thyroid disease     Review of Systems:  All pertinents listed in HPI, otherwise negative  Social: Lives with his parents here in [place]. Hx heroin use since the age of [age]. He has expressed a desire to quit using. He endorses occasionally taking a xanax for his anxiety that he buys off the street. Endorses marijuana use once a week. Denies alcohol use. Smokes 1/2 PPD x 3-4 years.   Family History: Dad with DM, Mom is healthy. Brother with a history of drug abuse (heroin).   Physical Exam:  Vitals:   03/31/17 1500  BP: (!) 103/45  Pulse: 62  Temp: 98.3 F (36.8 C)  TempSrc: Oral  SpO2: 99%  Weight: 139 lb 8 oz (63.3 kg)  Height: 5\' 11"  (1.803 m)    Constitutional: NAD, appears comfortable Cardiovascular: RRR, no murmurs, rubs, or gallops.  Pulmonary/Chest: CTAB, no wheezes, rales, or rhonchi.  Abdominal: Soft, non tender, non distended. +BS.  Extremities: Warm and well perfused. Distal pulses intact. No edema.  Neurological: A&Ox3, CN II - XII grossly intact.  Skin: No rashes or erythema  Psychiatric: Normal mood and affect  Assessment & Plan:   See Encounters Tab for problem based charting.  Patient seen with Dr. Oswaldo DoneVincent

## 2017-03-31 NOTE — Assessment & Plan Note (Signed)
Patient reports a history of hypothyroidism. He reports he has been well controlled on Synthroid 175 mcg for many years. Last TSH in our system was elevated at 7.7 in 2012.  -- Continue current dose for now -- Recheck TSH at follow up

## 2017-03-31 NOTE — Patient Instructions (Signed)
[name],  It was a pleasure to see you today. I will call you with the results of your blood work today. Please return to clinic in 1 weeks for a follow up appointment. If you have any questions or concerns, call our clinic at [phone] or after hours call [phone] and ask for the internal medicine resident on call. Thank you!  - Dr. Antony ContrasGuilloud

## 2017-03-31 NOTE — Assessment & Plan Note (Addendum)
Patient was discharged on 03/22/2017 after hospitalization for right upper extremity cellulitis. He presented with pain and swelling of his right biceps area after injecting illicit suboxone. He was endorsing numbness and tingling of his fingers and orthopedic surgery was consulted due to concern for compartment syndrome. He was management conservatively with IV abx and ultimately did not require surgery. He was treated with IV clindamycin and transitioned to Ancef on admission. Upper extremity ultrasound was negative for DVT. He was discharged on a 10 day course of doxycycline. He reports he has 1 more day left of his antibiotic course. Today on exam, his cellulitis has completely resolved. He denies fevers at home and is asymptomatic. -- Finish doxycycline

## 2017-03-31 NOTE — Assessment & Plan Note (Addendum)
Patient has a history of IV heroin use. He began abusing when he was 4414-21 years old. He has expressed a desire to quit. He has tried Suboxone in the past but was unsuccessful due to transportation issues and the clinic's daily visit requirements. Over the past few months, he had been trying to manage his opioid use disorder and withdrawal by purchasing illicit suboxone. He was taking one 8 mg tablet of suboxone daily but did not feel that his withdrawal symptoms were adequately controlled. He found his symptoms were better if he injected the suboxone. Unfortunately patient developed upper extremity cellulitis requiring IV antibiotics. Patient was evaluated by Dr. Oswaldo DoneVincent while in the hospital who felt he would be a good candidate for outpatient suboxone therapy. He was started on 8 mg BID while in the hospital and did well with this dose. He was discharged with a 1 week supply and [facility]. Today, he reports he had been doing well on suboxone. His cravings are well controlled. He denies any further heroin use. He did endorse purchasing xanax illegally for his symptoms of anxiety and depression and reported occasional marijuana use, about once a week. He was counseled on the cessation of these substances. He has signed a controlled substance agreement with our clinic and provided a urine sample today. Plan to continue current dose and return to clinic in 1 week for follow up.  -- Provided 1 week of Suboxone 8 mg BID  -- UDS today -- Follow up 1 week  -- Screening HIV, Hep B, and Hep C labs today   ADDENDUM: HIV, Hep B, and Hep C negative. UDS appropriate [facility] which was already addressed in clinic. Follow up scheduled today 5/29.

## 2017-04-01 LAB — HEPATITIS B SURFACE ANTIGEN: Hepatitis B Surface Ag: NEGATIVE

## 2017-04-01 LAB — BMP8+ANION GAP
ANION GAP: 14 mmol/L (ref 10.0–18.0)
BUN / CREAT RATIO: 14 (ref 9–20)
BUN: 11 mg/dL (ref 6–20)
CO2: 25 mmol/L (ref 18–29)
Calcium: 9.4 mg/dL (ref 8.7–10.2)
Chloride: 103 mmol/L (ref 96–106)
Creatinine, Ser: 0.76 mg/dL (ref 0.76–1.27)
GFR calc non Af Amer: 131 mL/min/{1.73_m2} (ref >59)
GFR, EST AFRICAN AMERICAN: 152 mL/min/{1.73_m2} (ref >59)
Glucose: 77 mg/dL (ref 65–99)
Potassium: 4.4 mmol/L (ref 3.5–5.2)
SODIUM: 142 mmol/L (ref 134–144)

## 2017-04-01 LAB — HIV ANTIBODY (ROUTINE TESTING W REFLEX): HIV Screen 4th Generation wRfx: NONREACTIVE

## 2017-04-01 LAB — HEPATITIS B SURFACE ANTIBODY,QUALITATIVE: Hep B Surface Ab, Qual: NONREACTIVE

## 2017-04-01 LAB — HEPATITIS B E ANTIBODY: HEP B E AB: NEGATIVE

## 2017-04-01 LAB — HEPATITIS C ANTIBODY: Hep C Virus Ab: <0.1 s/co ratio (ref 0.0–0.9)

## 2017-04-01 LAB — HEPATITIS B CORE ANTIBODY, TOTAL: Hep B Core Total Ab: NEGATIVE

## 2017-04-01 NOTE — Progress Notes (Signed)
Internal Medicine Clinic Attending  I saw and evaluated the patient.  I personally confirmed the key portions of the history and exam documented by Dr. Antony ContrasGuilloud and I reviewed pertinent patient test results.  The assessment, diagnosis, and plan were formulated together and I agree with the documentation in the resident's note.  [name] is a 21 year old man here for follow up of opioid use disorder. He is well known to me from hospitalization last week for injection-associated cellulitis of the right upper arm. We ruled out thrombosis with PVL that admission and he has responded well to doxycycline; no erythema of the arm present today. He has a five year history of opioid use disorder, mostly injects heroin and recently injected suboxone to try to treat himself. We managed him in the hospital with SL suboxone, and he has done well the last week with 16mg  daily. He denies any withdrawals, says cravings are improved, denies injecting in over one week. I think he is an appropriate candidate for chronic suboxone to manage his moderate OUD. We obtained a urine tox today, and completed a controlled medication contract. I reveiwed the NCCSRS which show one appropriate dispense of suboxone. I gave him a one week supply of Suboxone #14 tabs, he will follow up with us in clinic in one week.

## 2017-04-04 LAB — TOXASSURE SELECT,+ANTIDEPR,UR

## 2017-04-07 ENCOUNTER — Ambulatory Visit (INDEPENDENT_AMBULATORY_CARE_PROVIDER_SITE_OTHER): Payer: Medicaid Other | Admitting: Internal Medicine

## 2017-04-07 VITALS — BP 134/78 | HR 72 | Temp 98.3°F | Ht 71.0 in | Wt 131.3 lb

## 2017-04-07 DIAGNOSIS — F1120 Opioid dependence, uncomplicated: Secondary | ICD-10-CM | POA: Diagnosis not present

## 2017-04-07 DIAGNOSIS — F418 Other specified anxiety disorders: Secondary | ICD-10-CM

## 2017-04-07 MED ORDER — BUPRENORPHINE HCL-NALOXONE HCL 8-2 MG SL SUBL: 2.0000 | SUBLINGUAL_TABLET | Freq: Every day | SUBLINGUAL | 0 refills | Status: DC

## 2017-04-07 MED ORDER — BUSPIRONE HCL 5 MG PO TABS: 5.0000 mg | ORAL_TABLET | Freq: Two times a day (BID) | ORAL | 0 refills | Status: DC

## 2017-04-07 MED ORDER — BUPRENORPHINE HCL-NALOXONE HCL 8-2 MG SL FILM: 2.0000 | ORAL_FILM | Freq: Every day | SUBLINGUAL | 0 refills | Status: DC

## 2017-04-07 NOTE — Progress Notes (Addendum)
   CC: Opioid use disorder  HPI:  Mr.Quoc P Mcmurphy is a 21 y.o. male with past medical history outlined below here for follow up of his opioid use disorder. For the details of today's visit, please refer to the assessment and plan.  Past Medical History:  Diagnosis Date  . Depression   . Migraines   . Thyroid disease     Review of Systems:  Endorses depression and anxiety. Denies fevers and chills.    Physical Exam:  Vitals:   04/07/17 1458  BP: 134/78  Pulse: 72  Temp: 98.3 F (36.8 C)  TempSrc: Oral  SpO2: 100%  Weight: 131 lb 4.8 oz (59.6 kg)  Height: 5\' 11"  (1.803 m)    Constitutional: NAD, appears comfortable Cardiovascular: RRR, no murmurs, rubs, or gallops.  Pulmonary/Chest: CTAB Abdominal: Soft, non tender, non distended. +BS.  Extremities: Warm and well perfused. No edema.  Skin: No rashes or erythema  Psychiatric: Flat affect, depressed mood  Assessment & Plan:   See Encounters Tab for problem based charting.  Patient seen with Dr. Oswaldo DoneVincent

## 2017-04-07 NOTE — Patient Instructions (Addendum)
[name],  It was a pleasure to see you today. Please continue to take suboxone as previously prescribed. Please start taking buspar twice a day for your anxiety. Please follow up in 1 week. If you have any questions or concerns, call our clinic at [phone] or after hours call [phone] and ask for the internal medicine resident on call. Thank you!  - Dr. Antony ContrasGuilloud

## 2017-04-08 NOTE — Assessment & Plan Note (Addendum)
Patient is here today for follow up of his opioid use disorder. He was started on suboxone 8 mg films BID after his recent hospitalization for injection site cellulitis requiring IV antibiotics. He developed numbness and tingling in his right hand distal to the infection and there was some concern for compartment syndrome. He was managed non surgically. Today, he reports persistent numbness and tingling in his fingers, but feels it is overall improving. His cellulitis has completely resolved. He has been doing well on the 8 mg BID suboxone dose. He denies any cravings or relapse. He does admit to continued xanax use which he is purchasing illicitly to treat his depression, but reports cutting back to half of a tablet. He is unsure the dose. Reports the pills are blue. We had a long discussion today regarding treatment for his depression (see depression A&P) and advised patient to stop the xanax. He has stopped smoking marijuana since his last visit.  -- UDS today  -- Refilled suboxone 8 mg BID x 7 days  -- Follow up 1 week   ADDENDUM: UDS appropriate. Positive for Buprenorphine (expected), xanax (expected) and THC (quit using 1 week ago - can remain positive for weeks). Continue above management. Encourage xanax cessation.

## 2017-04-08 NOTE — Progress Notes (Signed)
Internal Medicine Clinic Attending  I saw and evaluated the patient.  I personally confirmed the key portions of the history and exam documented by Dr. Antony ContrasGuilloud and I reviewed pertinent patient test results.  The assessment, diagnosis, and plan were formulated together and I agree with the documentation in the resident's note.  21 year old man with moderate opioid use disorder who we have been treating with suboxone since 03/21/2017. He is doing well on 16mg  daily, no withdrawal and reports abstinence from heroin. We will continue with suboxone, Utox today, I reviewed NCCSRS which is appropriate, follow up in one week, I changed the suboxone formulation from tablet to film per his preference.   His anxiety and depression is a second long standing issue. He has a very flat affect, no history of axis 1 or 2 diagnoses, no prior suicide attempts or active SI. He has a very difficult home life, his brother still uses heroin, his father is chronically ill and the patient does not work, only takes care of his dad. He says he needs a medicine that will "act fast" when he gets anxious. We talked for a while about managing depression and anxiety. I advised him strongly to stop purchasing xanax, I advised that there is significant risk to illicit controlled meds with purity, dosing, and interactions. Start buspar and follow up in one week. We also referred for psychiatry/counseling given how high risk he is with concurrent OUD.

## 2017-04-08 NOTE — Assessment & Plan Note (Signed)
Patient has been self medicating with illicitly purchased xanax for many years. He was advised to stop the xanax at his last visit due to concern for interaction with his suboxone. He has continued to take xanax, but reports he has cut down to half of a tablet. He is unsure of the dose but reports the pills are blue. We discussed with patient that benzodiazepines are not first line therapy for depression, and counseled patient on starting and SSRI or SNRI. Patient reports he was on prozac many years ago and did not find this effective. He is also nervous about coming off of xanax as he feels it is effective for him. He does not want to wait for an SSRI or SNRI to become effective and is requesting something more immediate acting. We discussed starting Buspar BID. Patient was hesitant but agreeable to try. Advised him not to take xanax and Buspar together.  -- Stop xanax  -- Start Buspar 5 mg BID, up titrate as needed

## 2017-04-10 LAB — TOXASSURE SELECT,+ANTIDEPR,UR

## 2017-04-14 ENCOUNTER — Ambulatory Visit (INDEPENDENT_AMBULATORY_CARE_PROVIDER_SITE_OTHER): Payer: Medicaid Other | Admitting: Internal Medicine

## 2017-04-14 VITALS — BP 116/51 | HR 107 | Temp 98.2°F | Ht 71.0 in | Wt 135.3 lb

## 2017-04-14 DIAGNOSIS — F418 Other specified anxiety disorders: Secondary | ICD-10-CM

## 2017-04-14 DIAGNOSIS — Z760 Encounter for issue of repeat prescription: Secondary | ICD-10-CM | POA: Diagnosis not present

## 2017-04-14 DIAGNOSIS — F1120 Opioid dependence, uncomplicated: Secondary | ICD-10-CM

## 2017-04-14 MED ORDER — BUPRENORPHINE HCL-NALOXONE HCL 8-2 MG SL FILM: 2.0000 | ORAL_FILM | Freq: Every day | SUBLINGUAL | 0 refills | Status: DC

## 2017-04-14 MED ORDER — BUSPIRONE HCL 5 MG PO TABS: 10.0000 mg | ORAL_TABLET | Freq: Two times a day (BID) | ORAL | 0 refills | Status: DC

## 2017-04-14 NOTE — Assessment & Plan Note (Addendum)
His depression and anxiety are completed by social factors, including taking care of his dying father.  He has been taking illicitly obtained Xanax to treat his anxiety, usually 1-2 pills per day of unknown strength split up into 2 doses.  He requests prescription for Xanax, and says that he would like to taper off of it.  At last visit 1 week ago he was started on buspirone 5 mg twice daily.  He does not think it is helped his anxiety at all but he does have any side effects. He stopped taking it for one day last week when he didn't think it was helping, but otherwise has continued to take it.  Current medications: buspirone 5 mg BID  Dep[facility] 2[facility] 04/14/2017 04/07/2017 03/31/2017  Decreased Interest 3 3 1   Down, Depressed, Hopeless 3 3 3   PHQ - 2 Score 6 6 4   Altered sleeping 2 3 0  Tired, decreased energy 1 3 0  Change in appetite 0 0 0  Feeling bad or failure about yourself  0 0 0  Trouble concentrating 2 3 1   Moving slowly or fidgety/restless 0 0 -  Suicidal thoughts 0 0 0  PHQ-9 Score 11 15 5   Difficult doing work/chores Somewhat difficult - Somewhat difficult   GAD 7 : Generalized Anxiety Score 04/14/2017  Nervous, Anxious, on Edge 3  Control/stop worrying 3  Worry too much - different things 3  Trouble relaxing 3  Restless 3  Easily annoyed or irritable 3  Afraid - awful might happen 3  Total GAD 7 Score 21  Anxiety Difficulty Very difficult   A/P Uncontrolled depression and anxiety with comorbid polysubstance use disorder.   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

## 2017-04-14 NOTE — Assessment & Plan Note (Addendum)
Reports adherence to Suboxone 8 mg twice a day. Has been able to get medicine without difficulties with his Medicaid. He denies withdrawal symptoms and has not injected heroin since before his hospitalization 3 weeks ago.  -Refill Suboxone 8-2 mg twice a day for 7 days -Follow-up next week

## 2017-04-14 NOTE — Progress Notes (Signed)
   CC: "I'm here to get my Suboxone refilled."  HPI:  [name] is a 21 y.o. man with history of depression and opiate use disorder who presents for further management of opiate use disorder.  Please see A&P for status of the patient's chronic medical conditions.   Past Medical History:  Diagnosis Date  . Depression   . Migraines   . Thyroid disease     Review of Systems:    Review of Systems  Constitutional: Negative for chills and fever.  Respiratory: Negative for cough and shortness of breath.   Gastrointestinal: Negative for nausea and vomiting.  Neurological: Negative for dizziness and tremors.  Psychiatric/Behavioral: Positive for depression. The patient is nervous/anxious.     Physical Exam:  Vitals:   04/14/17 1420  BP: (!) 116/51  Pulse: (!) 107  Temp: 98.2 F (36.8 C)  TempSrc: Oral  SpO2: 100%  Weight: 135 lb 4.8 oz (61.4 kg)  Height: 5\' 11"  (1.803 m)   Physical Exam  Constitutional: He is oriented to person, place, and time.  Thin young man fidgeting and not making eye contact  Cardiovascular: Normal rate and regular rhythm.   Pulmonary/Chest: Effort normal and breath sounds normal.  Neurological: He is alert and oriented to person, place, and time.  Skin: Skin is warm and dry.  Psychiatric:  Poor eye contact Speech low volume Affect flat Mood anxious    Assessment & Plan:   See Encounters Tab for problem based charting.  Patient discussed with Dr. Rogelia BogaButcher

## 2017-04-14 NOTE — Patient Instructions (Addendum)
I have given you another prescription for a week's worth of Suboxone. I'm glad it's helping you in the evening able to stay away from heroin. Please schedule an appointment for next week to continue treatment.  For your depression and anxiety, please increase your BuSpar to 10 mg (2 pills) twice per day.  If you are not having side effects - most commonly dizziness, headache, and nausea - you can go up to taking 15 mg (3 pills) twice per day on Saturday.  We will work on helping you get an appointment with the psychiatrist.  Maryclare LabradorWe'll see you again next week.

## 2017-04-15 NOTE — Progress Notes (Signed)
Internal Medicine Clinic Attending  Case discussed with Dr. O'Sullivan at the time of the visit.  We reviewed the resident's history and exam and pertinent patient test results.  I agree with the assessment, diagnosis, and plan of care documented in the resident's note. 

## 2017-04-21 ENCOUNTER — Encounter: Payer: Self-pay | Admitting: Internal Medicine

## 2017-04-21 ENCOUNTER — Ambulatory Visit (INDEPENDENT_AMBULATORY_CARE_PROVIDER_SITE_OTHER): Payer: Medicaid Other | Admitting: Internal Medicine

## 2017-04-21 VITALS — BP 130/53 | HR 67 | Temp 97.7°F | Ht 71.0 in | Wt 133.2 lb

## 2017-04-21 DIAGNOSIS — F1120 Opioid dependence, uncomplicated: Secondary | ICD-10-CM

## 2017-04-21 DIAGNOSIS — F418 Other specified anxiety disorders: Secondary | ICD-10-CM | POA: Diagnosis not present

## 2017-04-21 MED ORDER — PAROXETINE HCL 20 MG PO TABS: 20.0000 mg | ORAL_TABLET | Freq: Every day | ORAL | 0 refills | Status: DC

## 2017-04-21 MED ORDER — BUPRENORPHINE HCL-NALOXONE HCL 8-2 MG SL FILM: 2.0000 | ORAL_FILM | Freq: Every day | SUBLINGUAL | 0 refills | Status: DC

## 2017-04-21 NOTE — Assessment & Plan Note (Signed)
HPI: He reports good adherence with his suboxone 8mg  films twice daily. He thinks the medicine is helping because he can keep taking it and avoid relapsing with heroin use. He is interested in decreasing his frequency of visits because he has to arrange a ride each time. He reports continued intermittent use of illicitly obtained xanax for breakthrough anxiety. He also reports sometimes smoking marijuana for this stress and anxiety but thinks it has been a week since his last use.  A: Opioid use disorder currently abstinent from heroin and on buprenorphine. He continues some illicit drug use on the side mostly for his mood disorder. He does not appear to have symptoms of withdrawal--a clinical opioid withdrawal scale (COWS) calculated today is 3, less than the 5-14 to suggest mild opioid withdrawal symptoms.  P: Refilled suboxone 8mg  BID x14 days Urine toxicology test today  P:

## 2017-04-21 NOTE — Assessment & Plan Note (Signed)
He remains depressed and anxious today. He tried to increase the dose of Buspar to 10mg  BID but started to have what he feels are migraine headaches. He is restless, fidgeting, and takes 30 minutes or more to fall asleep in bed at night. He is taking illicit xanax periodically when his stress feels too severe to cope. A PHQ-9 score today is high at 19. [facility] and reports losing the paperwork given to him last week.  A: Uncontrolled anxiety and depression. Mood disorder is complicated by substance use history and social stressors. He is not tolerating titration of up the prescribed buspar well and getting no symptom relief at the current dose.  P: Change to paroxetine 20mg  daily for anxiety/depression Discontinued buspar [facility]

## 2017-04-21 NOTE — Patient Instructions (Signed)
We will see you again in 2 weeks.  Keep up the good work.

## 2017-04-21 NOTE — Progress Notes (Signed)
   CC: 1 week follow up for suboxone treatment  HPI:  [name] is a 21 y.o. man here for refill of his suboxone and also for his depression.   See problem based assessment and plan below for additional details  Past Medical History:  Diagnosis Date  . Depression   . Migraines   . Thyroid disease     Review of Systems:  Review of Systems  Constitutional: Negative for diaphoresis.  HENT: Negative for congestion and nosebleeds.   Cardiovascular: Negative for palpitations.  Gastrointestinal: Negative for constipation, diarrhea and nausea.  Musculoskeletal: Negative for joint pain.  Neurological: Positive for headaches. Negative for weakness.  Psychiatric/Behavioral: Positive for depression. Negative for hallucinations and suicidal ideas. The patient is nervous/anxious.     Physical Exam: Physical Exam  Constitutional:  Thin young man in no acute distress  HENT:  Mouth/Throat: Oropharynx is clear and moist. No oropharyngeal exudate.  Cardiovascular: Normal rate and regular rhythm.   Pulmonary/Chest: Effort normal and breath sounds normal.  Musculoskeletal: He exhibits no edema.  Neurological: He is alert. He exhibits normal muscle tone.  No tremor  Psychiatric:  Appears anxious, fidgeting with hands, avoids eye contact    Vitals:   04/21/17 1409  BP: (!) 130/53  Pulse: 67  Temp: 97.7 F (36.5 C)  TempSrc: Oral  SpO2: 100%  Weight: 133 lb 3.2 oz (60.4 kg)  Height: 5\' 11"  (1.803 m)    Assessment & Plan:   See Encounters Tab for problem based charting.  Patient discussed with Dr. Criselda PeachesMullen

## 2017-04-27 LAB — TOXASSURE SELECT,+ANTIDEPR,UR

## 2017-04-27 NOTE — Progress Notes (Signed)
Internal Medicine Clinic Attending  I saw and evaluated the patient.  I personally confirmed the key portions of the history and exam documented by Dr. Dimple Caseyice and I reviewed pertinent patient test results.  The assessment, diagnosis, and plan were formulated together and I agree with the documentation in the resident's note.  21 year old man with moderate opioid use disorder who we have been treating with suboxone since 03/21/2017. He is doing well on 16mg  daily, no withdrawal, COWS < 5.  He reports no heroin use. We will continue with suboxone, Urine tox screen today, I reviewed NCCSRS which is appropriate.  He is requesting being seen in 2 weeks instead of 1.  Since his opioid use disorder is well controlled at this time, we will lengthen his inter appointment time to 2 weeks.  He has a severe anxiety disorder and is not doing well on Buspar per him.  He would like to try another option, so we have given him an Rx for paroxetine.  He continues to use alprazolam and this has shown up on his UDS.  We have given him information re: mental health providers.  He will follow up in 2 weeks.  I gave him a 14 day supply of suboxone.

## 2017-05-06 ENCOUNTER — Telehealth: Payer: Self-pay | Admitting: *Deleted

## 2017-05-06 NOTE — Telephone Encounter (Signed)
Have tried to call pt 3 times since no show in clinic 6/27, no answer, no vmail, will continue to reach out

## 2017-05-12 ENCOUNTER — Ambulatory Visit (INDEPENDENT_AMBULATORY_CARE_PROVIDER_SITE_OTHER): Payer: Medicaid Other | Admitting: Internal Medicine

## 2017-05-12 VITALS — BP 109/59 | HR 77 | Temp 97.9°F | Resp 18 | Ht 71.0 in | Wt 133.0 lb

## 2017-05-12 DIAGNOSIS — F1120 Opioid dependence, uncomplicated: Secondary | ICD-10-CM

## 2017-05-12 DIAGNOSIS — F418 Other specified anxiety disorders: Secondary | ICD-10-CM | POA: Diagnosis not present

## 2017-05-12 DIAGNOSIS — Z79899 Other long term (current) drug therapy: Secondary | ICD-10-CM | POA: Diagnosis not present

## 2017-05-12 MED ORDER — BUPRENORPHINE HCL-NALOXONE HCL 8-2 MG SL FILM: 2.0000 | ORAL_FILM | Freq: Every day | SUBLINGUAL | 0 refills | Status: DC

## 2017-05-12 MED ORDER — ALPRAZOLAM 0.5 MG PO TABS: 0.5000 mg | ORAL_TABLET | Freq: Two times a day (BID) | ORAL | 0 refills | Status: DC | PRN

## 2017-05-12 NOTE — Progress Notes (Signed)
   05/12/2017  [name] presents for buprenorphine/naloxone follow up visit.  I have reviewed the prior induction visit, follow up visits, and telephone encounters relevant to opiate use disorder (OUD) treatment.   Current daily dose: Buprenorphine-naloxone films 8-2mg  BID  Number of weeks since induction: 5 weeks (03/23/17)  Current follow up interval, in weeks: 2 weeks  The patient has been adherent to the buprenorphine for OUD contract: yes (contract signed 04/01/17)  UDS: Suboxone present.  He has Xanax which he reported present, and THC also self reported.   HPI: Reports that he is having increased pain in his right hand, numbness of first three fingers, worse in the morning.   Severe anxiety today, father is in hospital and he has been told that he may not survive a surgery that he has to undergo.  He has been out of Xanax off and on for the last week.  Attempted to stop cold [place] and had a seizure about 5 days ago, witnessed by brother.  Did not seek medical care.  He has now been out again for about 2 days and is worried he may have another seizure.  His opiate withdrawal symptoms are well controlled and he reports minimal cravings today.    Reports that he did not start paroxetine as requested at last visit.   Exam:   Vitals:   05/12/17 0902  BP: (!) 109/59  Pulse: 77  Resp: 18  Temp: 97.9 F (36.6 C)  TempSrc: Oral  SpO2: 100%  Weight: 133 lb (60.3 kg)  Height: 5\' 11"  (1.803 m)    General: Thin gentleman, flat affect Pulm: Breathing comfortably, no audible wheezing Ext: Multiple chronic skin changes on the legs.  He has no new erythema at site of last cellulitis Psych: Flat affect, tearful during exam, seems very nervous, anxious.   Assessment/Plan:  Medication Management - Patient is doing well on current daily dose, which is continued today.  UDS ordered.    Follow up interval: every 1 weeks, decreased from 2 weeks. Will attempt to wean off of Xanax to  avoid a medical misadventure and further seizures.    Anxiety and Depression - given his long term use of xanax  (since 21 yo), it is my opinion it would be safer for us to wean him off of the medication than his continued use off the streets and intermittent withdrawal.  I discussed this with him today.  Plan will be to start paroxetine as previously prescribed and he will get 0.5mg  of xanax BID (home dose was about 0.5 TID when he could get it).  I strongly advised him to stop buying medications off the street.  If he does not start paroxetine, we should consider a rapid taper.    I advised him to keep trying to get in to see a psychiatrist.  He reports that he went for an appointment, but only filled out paperwork and will be seen again in a few weeks.  He feels very frustrated with difficulty finding psychiatric care.    Wrist pain: Given description, may be CTS.  Advised him to try bracing.      [name], [name] B, MD  05/12/2017  9:17 AM

## 2017-05-12 NOTE — Patient Instructions (Addendum)
[name] - -   Thank you for coming in to see me today.    I am sorry you are going through such a hard time with your father.    It is important that you wean off of Xanax more slowly.  It seems you were taking about 0.5mg  three times per day.  I will provide you with a prescription for 0.5mg  twice a day for a 1 week supply today to help avoid any further seizures and start weaning off the medication.  At the same time you are taking this, please start taking the Paroxetine 20mg  which you were prescribed at last visit.  This will help your anxiety better than the xanax and make it easier to decrease your dose of the xanax.    Plan Take 0.5mg  xanax up to twice per day Take Paroxetine 20mg  daily  Continue taking Suboxone 8-2mg  twice a day  For your wrist pain, I think this might be carpal tunnel syndrome.  Please try a brace on your wrist at night.  This type of brace can be purchased at any drug store.     Benzodiazepine Withdrawal Benzodiazepines are prescription medicines that decrease the activity of (depress) the central nervous system and cause changes in certain brain chemicals (neurotransmitters). Withdrawal is a group of physical and mental symptoms that can happen when you suddenly stop taking a medicine. There are many types of benzodiazepines. Some benzodiazepines take effect quickly and stay in your system for a short amount of time (short-acting). Other benzodiazepines require more time to take effect and stay in your system for longer amounts of time (long-acting). The five most commonly prescribed benzodiazepines are:  Alprazolam.  Lorazepam.  Clonazepam.  Diazepam.  Temazepam.  What are the causes? When you take benzodiazepines, your brain needs more and more of the medicine over time in order to get the same effects from it. This increased need is called tolerance. As you develop a tolerance, your brain adapts to the effects of the benzodiazepine and relies on these  effects. This is called dependency. Withdrawal happens when you suddenly stop taking your medicine. This does not give your brain enough time to adapt to not having the medicine. What increases the risk? This condition is more likely to develop in:  People who have taken benzodiazepines for more than 1-2 weeks.  People who have developed a tolerance for benzodiazepines.  People who have developed a dependence on benzodiazepines.  People who take high dosages of benzodiazepines.  People who take doses of benzodiazepines that are higher than prescribed.  People who take benzodiazepines without a prescription.  People who use benzodiazepines with other substances that depress the central nervous system, such as alcohol.  People who have a history of drug or alcohol abuse.  What are the signs or symptoms? Symptoms of this condition may include:  Difficulty sleeping.  Anxiety.  Restlessness.  Irritability.  Muscle aches.  Involuntary shaking or trembling of a body part (tremor).  Confusion and poor concentration.  Vomiting.  Sweating.  Headaches.  Feeling or seeing things that are not there (hallucinations).  Seizures.  Symptoms of withdrawal from short-acting benzodiazepines may develop 1-2 days after you stop taking your medicine, and they may last for 2-4 weeks or longer. Symptoms of withdrawal from long-acting benzodiazepines may develop 2-7 days after you stop taking your medicine, and they may last for 2-8 weeks or longer. How is this diagnosed? This condition may be diagnosed based on:  Your symptoms.  A physical exam. Your health care provider may check for: ? Rapid heartbeat. ? Rapid breathing. ? Tremors. ? High blood pressure.  Blood tests.  Urine tests.  Your alcohol and drug habits.  Your medical history.  How is this treated? Treatment for this condition depends on:  Your symptoms.  The type of benzodiazepine you have been  taking.  How long you have been taking benzodiazepines.  Treatment usually involves starting you on a safe and stable dose of a benzodiazepine and then slowly lowering your dosage over time (tapered withdrawal). This may be done at a hospital or a treatment center. Long-term treatment for this condition may involve medicine, counseling, and support groups. Follow these instructions at home:  Take over-the-counter and prescription medicines only as told by your health care provider.  Check with your health care provider before starting new medicines.  Keep all follow-up visits as told by your health care provider. This is important. How is this prevented?  Do not take any benzodiazepines without a prescription.  Do not take more than your prescribed dosage.  Do not mix benzodiazepines with alcohol or other medicines.  Do not stop taking benzodiazepines without speaking with your health care provider. Contact a health care provider if:  You are not able to take your medicines as told by your health care provider.  You have symptoms that get worse.  You develop withdrawal symptoms during your tapered withdrawal.  You develop a craving for drugs or alcohol.  You experience withdrawal again (relapse). Get help right away if:  You have a seizure.  You become very confused.  You lose consciousness.  You have difficulty breathing.  You have serious thoughts about hurting yourself or someone else. This information is not intended to replace advice given to you by your health care provider. Make sure you discuss any questions you have with your health care provider. Document Released: 10/16/2011 Document Revised: 03/06/2016 Document Reviewed: 04/18/2015 Elsevier Interactive Patient Education  2018 [employer]   Carpal Tunnel Syndrome Carpal tunnel syndrome is a condition that causes pain in your hand and arm. The carpal tunnel is a narrow area that is on the palm side of  your wrist. Repeated wrist motion or certain diseases may cause swelling in the tunnel. This swelling can pinch the main nerve in the wrist (median nerve). Follow these instructions at home: If you have a splint:  Wear it as told by your doctor. Remove it only as told by your doctor.  Loosen the splint if your fingers: ? Become numb and tingle. ? Turn blue and cold.  Keep the splint clean and dry. General instructions  Take over-the-counter and prescription medicines only as told by your doctor.  Rest your wrist from any activity that may be causing your pain. If needed, talk to your employer about changes that can be made in your work, such as getting a wrist pad to use while typing.  If directed, apply ice to the painful area: ? Put ice in a plastic bag. ? Place a towel between your skin and the bag. ? Leave the ice on for 20 minutes, 2-3 times per day.  Keep all follow-up visits as told by your doctor. This is important.  Do any exercises as told by your doctor, physical therapist, or occupational therapist. Contact a doctor if:  You have new symptoms.  Medicine does not help your pain.  Your symptoms get worse. This information is not intended to replace advice given to  you by your health care provider. Make sure you discuss any questions you have with your health care provider. Document Released: 10/16/2011 Document Revised: 04/03/2016 Document Reviewed: 03/14/2015 Elsevier Interactive Patient Education  [employer].

## 2017-05-18 LAB — TOXASSURE SELECT,+ANTIDEPR,UR

## 2017-05-19 ENCOUNTER — Ambulatory Visit (INDEPENDENT_AMBULATORY_CARE_PROVIDER_SITE_OTHER): Payer: Medicaid Other | Admitting: Student in an Organized Health Care Education/Training Program

## 2017-05-19 ENCOUNTER — Encounter: Payer: Self-pay | Admitting: Student in an Organized Health Care Education/Training Program

## 2017-05-19 VITALS — BP 120/60 | HR 93 | Temp 97.5°F | Resp 18 | Wt 128.8 lb

## 2017-05-19 DIAGNOSIS — F418 Other specified anxiety disorders: Secondary | ICD-10-CM

## 2017-05-19 DIAGNOSIS — F1120 Opioid dependence, uncomplicated: Secondary | ICD-10-CM

## 2017-05-19 MED ORDER — BUPRENORPHINE HCL-NALOXONE HCL 8-2 MG SL FILM: 2.0000 | ORAL_FILM | Freq: Every day | SUBLINGUAL | 0 refills | Status: DC

## 2017-05-19 MED ORDER — CLONAZEPAM 0.5 MG PO TABS: 0.5000 mg | ORAL_TABLET | Freq: Two times a day (BID) | ORAL | 0 refills | Status: DC | PRN

## 2017-05-19 NOTE — Assessment & Plan Note (Signed)
Patient was inducted on Suboxone during acute hospitalization on 03/21/17. He has been sober since that time, no heroin use for almost 2 months. This is the longest he has gone without heroin in many years. He reports feeling proud of himself for this accomplishment. Other family members also have given him a lot of support. UDS last week was appropriate. [facility]'s [facility] last 5 weeks. Control database shows appropriate dispensing. We are going to transitioned him to every 2 week appointments at this point. I gave him a refill of Suboxone 16 mg daily, #28 for a two week supply.

## 2017-05-19 NOTE — Assessment & Plan Note (Signed)
Symptomatically stable. When the patient was seen last week he had severe depression and anxiety, and had also  potentially had a benzo withdrawal seizure while at home. today is much improved from this. He is prescribed alprazolam 0.5 mg been taking it twice a day which he says has helped with both his withdrawal and his anxiety. Says he has not started approximately because he continues to forget to take it. We discussed that the benzo is not a long-term solution, the paroxetine is going to be important as we transition off benzos in the coming weeks-months. He has significant acute stress right now with his father in the hospital, significantly ill. So I don't think right now is the time to taper down the benzo's. Instead I'm going to switch alprazolam to clonazepam because of the better safety profile. I prescribed him clonazepam 0.5 mg twice a day when necessary, #28 for a two-week supply.

## 2017-05-19 NOTE — Progress Notes (Signed)
   05/19/2017  Kevin RaddleMichael P Madding presents for buprenorphine/naloxone follow up visit.  I have reviewed the prior induction visit, follow up visits, and telephone encounters relevant to opiate use disorder (OUD) treatment.   Current daily dose: 16mg  daily  Date Inducted: 03/21/2017  Current follow up interval, in weeks: 1  The patient has been adherent to the buprenorphine for OUD contract: Yes  UDS: Appropriate for Bupe and Alprazolam  HPI: 21 year old man here for follow-up of moderate-severe opioid use disorder. He was a daily heroin user since around the age of 21, last hospitalized in early May for soft tissue infection related to IV drug injection. We started him on buprenorphine at that hospitalization and have continued him since discharge. He's doing fairly well, reports abstinence from heroin, cravings are under control, no signs of withdrawal. He's done well with the Suboxone, keeps it in a safe at home. He receives it for free with Medicaid as his payer. UDS is have been appropriate and database has been appropriate.  His second issue has been very difficult to control depression and anxiety. He reports a long history of depression since he was an adolescent. He self treated with alprazolam that he obtained illicitly on a daily basis for many years. We tried getting him to take BuSpar, but this wasn't successful in managing his symptoms. He continue to use alprazolam and then abruptly stopped 2 weeks ago after one of our visits. He reported feeling worsening symptoms and then potentially even a withdrawal seizure at home. In response last week Dr. Criselda PeachesMullen prescribed him twice daily alprazolam. He reports that this is helped his symptoms greatly. He has acute distress right now because his father is been hospitalized for about 2 weeks due to soft tissue infections from her prior back surgery. He says that his father has to have another extensive surgery to remove hardware. He reports his  father does not use IV drugs, but does use oral opioids for chronic pain. 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 Denies alcohol use, reports that he continues to use marijuana intermittently.  Exam:   Vitals:   05/19/17 1109  BP: 120/60  Pulse: 93  Resp: 18  Temp: (!) 97.5 F (36.4 C)  TempSrc: Oral  SpO2: 100%  Weight: 128 lb 12.8 oz (58.4 kg)   Gen: Anhedonic appearing young man, no distress.  CV: RRR, no murmurs Lungs: unlabored, clear throughout Ext: Warm, well perfused, multiple red macules on his legs that look like insect bites, do not look like injection sites.   Assessment/Plan:  See Problem Based Charting.  Medication Management - Patient is doing well on current daily dose, which is continued today.  UDS ordered.    Follow up interval: every 2 weeks starting today.   [name], Duncan Thomas, MD  05/19/2017  11:00 AM

## 2017-05-19 NOTE — Patient Instructions (Signed)
1. Continue the suboxone as written. You are doing a great job, you have been sober since 03/21/2017!  2. I am switching your Xanax to Klonopin, which is a similar but safer benzo. We will work to bring the Klonopin off slowly over the coming months.

## 2017-05-23 LAB — TOXASSURE SELECT,+ANTIDEPR,UR

## 2017-06-02 ENCOUNTER — Ambulatory Visit (INDEPENDENT_AMBULATORY_CARE_PROVIDER_SITE_OTHER): Payer: Medicaid Other | Admitting: Student in an Organized Health Care Education/Training Program

## 2017-06-02 VITALS — BP 130/66 | HR 58 | Temp 98.2°F | Wt 127.9 lb

## 2017-06-02 DIAGNOSIS — Z681 Body mass index (BMI) 19 or less, adult: Secondary | ICD-10-CM | POA: Diagnosis not present

## 2017-06-02 DIAGNOSIS — R634 Abnormal weight loss: Secondary | ICD-10-CM | POA: Diagnosis not present

## 2017-06-02 DIAGNOSIS — F1120 Opioid dependence, uncomplicated: Secondary | ICD-10-CM | POA: Diagnosis not present

## 2017-06-02 DIAGNOSIS — F17210 Nicotine dependence, cigarettes, uncomplicated: Secondary | ICD-10-CM

## 2017-06-02 DIAGNOSIS — Z79891 Long term (current) use of opiate analgesic: Secondary | ICD-10-CM | POA: Diagnosis not present

## 2017-06-02 DIAGNOSIS — Z8349 Family history of other endocrine, nutritional and metabolic diseases: Secondary | ICD-10-CM

## 2017-06-02 DIAGNOSIS — Z79899 Other long term (current) drug therapy: Secondary | ICD-10-CM

## 2017-06-02 DIAGNOSIS — E039 Hypothyroidism, unspecified: Secondary | ICD-10-CM | POA: Diagnosis not present

## 2017-06-02 DIAGNOSIS — F418 Other specified anxiety disorders: Secondary | ICD-10-CM

## 2017-06-02 MED ORDER — CLONAZEPAM 0.5 MG PO TABS: 0.5000 mg | ORAL_TABLET | Freq: Two times a day (BID) | ORAL | 0 refills | Status: DC | PRN

## 2017-06-02 MED ORDER — BUPRENORPHINE HCL-NALOXONE HCL 8-2 MG SL FILM: 2.0000 | ORAL_FILM | Freq: Every day | SUBLINGUAL | 0 refills | Status: DC

## 2017-06-02 NOTE — Assessment & Plan Note (Signed)
Depression and anxiety are currently stable, current medications are proxetine 20 mg which she started one week ago and clonazepam 0.5 mg twice daily. Patient has a long history of using illicit benzos and finds significant benefit from them. Clinically he looks really well today and his symptoms are under good control. Plan is to continue with current dosing, I think in 2 weeks we can consider increasing the paroxetine to 40 mg daily and start working on a slow taper of clonazepam. Patient also has a follow-up appointment with behavioral health coming up in August which will be helpful in managing his medications.

## 2017-06-02 NOTE — Assessment & Plan Note (Signed)
Patient mentioned unintentional weight loss. Looking back in our office and he has lost about 12 pounds in the last 2 months. Current weight is 127 pounds which is a BMI around 18.  he does have hypothyroidism on levothyroxin which she tells me is being managed by his primary care physician and he recently did have blood work and a dose adjustments. Weight loss is not a known side effect of Suboxone. I do wonder if he has poor access to nutrition at home. Plan is to continue to monitor, if he continues to have significant weight loss we can do a further workup with compressive metabolic panel.

## 2017-06-02 NOTE — Progress Notes (Signed)
   06/02/2017  [name] presents for follow up of opioid use disorder I have reviewed the prior induction visit, follow up visits, and telephone encounters relevant to opiate use disorder (OUD) treatment.   Current daily dose: 16 mg daily  Date of Induction: 03/21/2017  Current follow up interval, in weeks: 2  The patient has been adherent to the buprenorphine for OUD contract:   Last UDS Result: Appropriate two weeks ago  HPI: [age] year old man here for follow-up of opioid use disorder. Patient reports overall doing very well. He had 1 relapse last Saturday night. He was over at a friend's house, there were other opioids presents. He at first reported using one tablet of oxycodone, he is a little unsure what the medication was but thought it was an opioid. He didn't feel much benefit from it and then says that he passed out and doesn't remember what happened. He later then confessed to me that he actually crush the tablet first and try to inject it but was unable to find a vein and so then he took the liquid by mouth. Patient reports that typically his cravings are very well controlled and he has no withdrawal symptoms. He thinks that the trigger for using it again was being around his friends. He says all of his friends and much of his family use opiates. He says that it is everywhere. Denies any fevers or chills. 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 His father was recently very ill and admitted to the hospital, he has returned home, [name] spends a lot of time helping his father with daily activities.  Exam:   Vitals:   06/02/17 1006  BP: 130/66  Pulse: (!) 58  Temp: 98.2 F (36.8 C)  TempSrc: Oral  SpO2: 97%  Weight: 127 lb 14.4 oz (58 kg)    Gen: Sad appearing young man, very quiet and anhedonic Skin: Recent injection marks on his right upper extremity CV: RRR, no murmurs Lungs: unlabored, clear throughout Ext: Warm, normal  joints.   Assessment/Plan:  See Problem Based Charting in the Encounters Tab  Follow up interval: every 2 weeks   [name] AliasVincent, Kevin Vise Thomas, MD  06/02/2017  2:17 PM

## 2017-06-02 NOTE — Assessment & Plan Note (Signed)
Moderate opioid use disorder which is symptomatically stable on medication assisted therapy with Suboxone since 03/21/17. After 2 months as bright he did have one relapse last week related to being around friends who are opioid users. Plan is to continue with Suboxone 16 mg sublingual daily, provide him with a two-week refill. Tox screen from last visit was appropriate. I reviewed the controlled substance database which shows appropriate dispensing. Tox screen was obtained today given recent relapse. Follow up in 2 weeks.

## 2017-06-09 LAB — TOXASSURE SELECT,+ANTIDEPR,UR

## 2017-06-16 ENCOUNTER — Telehealth: Payer: Self-pay | Admitting: *Deleted

## 2017-06-16 ENCOUNTER — Ambulatory Visit (INDEPENDENT_AMBULATORY_CARE_PROVIDER_SITE_OTHER): Payer: Medicaid Other | Admitting: Student in an Organized Health Care Education/Training Program

## 2017-06-16 ENCOUNTER — Other Ambulatory Visit: Payer: Self-pay | Admitting: Family Medicine

## 2017-06-16 VITALS — BP 129/69 | HR 66 | Temp 98.7°F | Resp 20 | Wt 124.2 lb

## 2017-06-16 DIAGNOSIS — Z79899 Other long term (current) drug therapy: Secondary | ICD-10-CM | POA: Diagnosis not present

## 2017-06-16 DIAGNOSIS — F1120 Opioid dependence, uncomplicated: Secondary | ICD-10-CM | POA: Diagnosis not present

## 2017-06-16 DIAGNOSIS — F418 Other specified anxiety disorders: Secondary | ICD-10-CM

## 2017-06-16 DIAGNOSIS — Z79891 Long term (current) use of opiate analgesic: Secondary | ICD-10-CM | POA: Diagnosis not present

## 2017-06-16 DIAGNOSIS — F17210 Nicotine dependence, cigarettes, uncomplicated: Secondary | ICD-10-CM | POA: Diagnosis not present

## 2017-06-16 MED ORDER — BUPRENORPHINE HCL-NALOXONE HCL 8-2 MG SL FILM: 2.0000 | ORAL_FILM | Freq: Every day | SUBLINGUAL | 0 refills | Status: DC

## 2017-06-16 MED ORDER — CLONAZEPAM 0.5 MG PO TABS
0.5000 mg | ORAL_TABLET | Freq: Two times a day (BID) | ORAL | 0 refills | Status: DC | PRN
Start: 2017-06-16 — End: 2017-06-23

## 2017-06-16 NOTE — Telephone Encounter (Signed)
Called clonazepam to [facility] per dr Oswaldo Donevincent

## 2017-06-16 NOTE — Assessment & Plan Note (Signed)
Doing well on clonazepam. We went over u tox results together. He is going to abstain from mixing benzos, he understands that this may put our contract in jeopardy in the future. Plan is to continue clonazepam, I forgot to give him the paper script in the visit so we will call it into his pharmacy today.

## 2017-06-16 NOTE — Patient Instructions (Signed)
Continue your medications as you are taking them. Follow up with your regular doctor about your thyroid dosing.

## 2017-06-16 NOTE — Telephone Encounter (Signed)
done

## 2017-06-16 NOTE — Progress Notes (Signed)
   06/16/2017  Kevin RaddleMichael P Cathell presents for follow up of opioid use disorder I have reviewed the prior induction visit, follow up visits, and telephone encounters relevant to opiate use disorder (OUD) treatment.   Current daily dose: Suboxone 16mg  daily  Date of Induction: 03/21/2017  Current follow up interval, in weeks: 2  The patient has not been adherent with the buprenorphine for OUD contract.   Last UDS Result: Inappropriate  HPI: 21 year old man here for follow up of severe opioid use disorder. He is doing much better this visit, compared to last. He denies any further relapses. Says his urine will be appropriate today. He is worried that he is going to lose Medicaid coverage because he is turning 21 in two weeks. He has not received a letter to that effect that he knows of. He reports eating and drinking well. 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 His father is back in the hospital with sepsis, but he says he is doing better now.   Exam:   Vitals:   06/16/17 1050  BP: 129/69  Pulse: 66  Resp: 20  Temp: 98.7 F (37.1 C)  TempSrc: Oral  SpO2: 100%  Weight: 124 lb 3.2 oz (56.3 kg)    Gen: Well appearing young man, no distress CV: RRR, no murmurs Lungs: unlabored, clear throughout Ext: warm, no new track marks on his arms Psych: well cared for, depressed appearing  Assessment/Plan:  See Problem Based Charting in the Encounters Tab   [name], Agustine Rossitto Thomas, MD  06/16/2017  2:36 PM

## 2017-06-16 NOTE — Telephone Encounter (Signed)
PATIENT CALLED HE WAS GIVEN REFILL FOR ONE MEDICATION AT HIS VISIT TODAY BUT HE NEEDS ANOTHER MEDICATION REFILL, PLEASE CALL HIM AT [phone].

## 2017-06-16 NOTE — Assessment & Plan Note (Signed)
Severe opioid use disorder who is doing better this week on MAT. He has had no further relapses. We went over his u tox together, we knew that it would show oxycodone and he also admitted to taking extra alprazolam. He did not have an explanation for the heroin components, or the missing clonazepam/buprenorphine. I warned him that we would not be able to continue prescribing either agent if we think he is diverting, I actually think this is low risk in his case. Plan will be to continue with suboxone 2 films daily. He is worried that he will lose medicaid coverage in 2 weeks when he turns 21. I asked him to call medicaid so better understand if he will lose coverage, follow up with us in one week, and we can apply for pharmacy assistance if he needs.

## 2017-06-19 LAB — TOXASSURE SELECT,+ANTIDEPR,UR

## 2017-06-23 ENCOUNTER — Ambulatory Visit (INDEPENDENT_AMBULATORY_CARE_PROVIDER_SITE_OTHER): Payer: Medicaid Other | Admitting: Student in an Organized Health Care Education/Training Program

## 2017-06-23 VITALS — BP 134/67 | HR 85 | Temp 98.1°F | Ht 71.0 in | Wt 136.7 lb

## 2017-06-23 DIAGNOSIS — F418 Other specified anxiety disorders: Secondary | ICD-10-CM

## 2017-06-23 DIAGNOSIS — F1120 Opioid dependence, uncomplicated: Secondary | ICD-10-CM

## 2017-06-23 DIAGNOSIS — Z9119 Patient's noncompliance with other medical treatment and regimen: Secondary | ICD-10-CM | POA: Diagnosis not present

## 2017-06-23 DIAGNOSIS — E010 Iodine-deficiency related diffuse (endemic) goiter: Secondary | ICD-10-CM | POA: Diagnosis not present

## 2017-06-23 MED ORDER — CLONAZEPAM 0.5 MG PO TABS: 0.5000 mg | ORAL_TABLET | Freq: Two times a day (BID) | ORAL | 0 refills | Status: DC | PRN

## 2017-06-23 MED ORDER — BUPRENORPHINE HCL-NALOXONE HCL 8-2 MG SL FILM: ORAL_FILM | SUBLINGUAL | 0 refills | Status: DC

## 2017-06-23 NOTE — Assessment & Plan Note (Signed)
Worsening depression today, the patient was very tearful and appears to be struggling at home. This is at the same time that he is also relapsing back into opioid use disorder. I am worried about continuing clonazepam, patient is been taking this more than prescribed and running out early. U tox is have not shown clonazepam in his system. He continues to use alprazolam illicitly. Plan is for the patient to follow-up with psychiatry at behavioral health. He has an appointment there on Friday. I gave the patient is small prescription of clonazepam 0.5 mg 8 tablets, which in addition to the last prescription I wrote him last week Will get him to our follow-up next Tuesday. We set a goal that he is going to take the clonazepam as prescribed, not use any further illicit alprazolam. If he is unable to follow this goal at the next visit I think we should stop prescribing clonazepam. It will also be helpful to have the insights of psychiatry.

## 2017-06-23 NOTE — Progress Notes (Signed)
   06/23/2017  Kevin RaddleMichael P Meyn presents for follow up of opioid use disorder I have reviewed the prior induction visit, follow up visits, and telephone encounters relevant to opiate use disorder (OUD) treatment.   Current daily dose: Suboxone 16 mg daily  Date of Induction: 03/21/2017  Current follow up interval, in weeks: 1  The patient has not been adherent with the buprenorphine for OUD contract.   Last UDS Result: Inappropriate, high amounts of buprenorphine but no detectable metabolites, he reports taking it right as he walked into the clinic. He denied dissolving buprenorphine in the urine sample. There continues to be signs of heroin use, alprazolam illicitly, and no clonazepam seen.  HPI: 21 year old man with severe opioid use disorder here for follow-up of medication-assisted treatment. The patient was doing very well with buprenorphine for about 2 months. Over the last 3 weeks he has been in relapse. He came into the clinic today very tearful and depressed, for the beginning of the visit he came clean with me that he has not been honest with us and he has been in relapse for several weeks. He told me that he does have a girlfriend and she is a active heroin user. He has been snorting heroin along with her, has not injected. Also occasionally using oxycodone. He's been taking clonazepam 3 tablets a day, which is more than prescribed. He says that means that he runs out early and then takes alprazolam that he obtains illicitly. He still reports having very strong cravings for using opioids. Says that the alprazolam helps with the cravings.  Exam:   Vitals:   06/23/17 1034  BP: 134/67  Pulse: 85  Temp: 98.1 F (36.7 C)  TempSrc: Oral  SpO2: 100%  Weight: 136 lb 11.2 oz (62 kg)  Height: 5\' 11"  (1.803 m)    Gen: Well-appearing young man, no distress but very tearful. Neck: Mild thyromegaly, supple Extremities: Hands are cold to touch bilaterally, no track marks Neurologic: Alert  and oriented, conversational, normal gait and normal strength Psychiatric: Depressed appearing, easily tearful.  Assessment/Plan:  See Problem Based Charting in the Encounters Tab   [name], Rhylen Shaheen Thomas, MD  06/23/2017  2:12 PM

## 2017-06-23 NOTE — Assessment & Plan Note (Signed)
Doing poorly, currently in relapse. The major risk factor is that his girlfriend is an active heroin user. Patient also continues to have strong cravings which leads him to come off the Suboxone for at least a day so that he can use. He feels that alprazolam is helpful for his cravings, but I counseled against this approach. Instead I want to increase the Suboxone dose to try to treat his overall cravings. Plan is to increase to 2.5 films per day, 20 mg, I gave him a small prescription to supplements the 2 week supply that I gave him 1 week ago. U tox was not obtained today, I didn't see it necessary. I reviewed the database which was appropriate. I counseled him on coping strategies regarding his girlfriend who is an active user. I also [facility] the patient [facility]

## 2017-06-30 ENCOUNTER — Ambulatory Visit (INDEPENDENT_AMBULATORY_CARE_PROVIDER_SITE_OTHER): Payer: Medicaid Other | Admitting: Internal Medicine

## 2017-06-30 VITALS — BP 126/68 | HR 94 | Temp 98.0°F | Ht 71.0 in | Wt 132.0 lb

## 2017-06-30 DIAGNOSIS — F418 Other specified anxiety disorders: Secondary | ICD-10-CM | POA: Diagnosis not present

## 2017-06-30 DIAGNOSIS — F1990 Other psychoactive substance use, unspecified, uncomplicated: Secondary | ICD-10-CM | POA: Diagnosis not present

## 2017-06-30 DIAGNOSIS — F1210 Cannabis abuse, uncomplicated: Secondary | ICD-10-CM

## 2017-06-30 DIAGNOSIS — G8929 Other chronic pain: Secondary | ICD-10-CM

## 2017-06-30 DIAGNOSIS — Z9114 Patient's other noncompliance with medication regimen: Secondary | ICD-10-CM | POA: Diagnosis not present

## 2017-06-30 DIAGNOSIS — Z608 Other problems related to social environment: Secondary | ICD-10-CM | POA: Diagnosis not present

## 2017-06-30 DIAGNOSIS — G5601 Carpal tunnel syndrome, right upper limb: Secondary | ICD-10-CM

## 2017-06-30 DIAGNOSIS — F1120 Opioid dependence, uncomplicated: Secondary | ICD-10-CM

## 2017-06-30 DIAGNOSIS — M25531 Pain in right wrist: Secondary | ICD-10-CM

## 2017-06-30 MED ORDER — CLONAZEPAM 0.5 MG PO TABS: ORAL_TABLET | ORAL | 0 refills | Status: DC

## 2017-06-30 MED ORDER — BUPRENORPHINE HCL-NALOXONE HCL 8-2 MG SL FILM: ORAL_FILM | SUBLINGUAL | 0 refills | Status: DC

## 2017-06-30 MED ORDER — MELOXICAM 15 MG PO TABS: 15.0000 mg | ORAL_TABLET | Freq: Every day | ORAL | 0 refills | Status: DC

## 2017-06-30 NOTE — Patient Instructions (Signed)
[name] - -  Thank you for coming in today.   Please continue your suboxone at 2 and 1/2 films per day.  You can take an extra half a tab of your clonazepam at night.  However, it is very important that you stick to the prescribed dose and not take more than prescribed.    Please try Mobic (meloxicam) and a wrist brace for your wrist pain.  Ask at the pharmacy for a wrist brace for carpal tunnel syndrome.    Please keep your psychiatry appointment on 8/30.  It is important that you get on a long term prescription for your depression and anxiety.  Once you are regularly seeing a psychiatrist, we will transfer your prescriptions for anxiety medicines to that doctor.   Thank you!  Come back in 1 week.    Meloxicam tablets What is this medicine? MELOXICAM (mel OX i cam) is a non-steroidal anti-inflammatory drug (NSAID). It is used to reduce swelling and to treat pain. It may be used for osteoarthritis, rheumatoid arthritis, or juvenile rheumatoid arthritis. This medicine may be used for other purposes; ask your health care provider or pharmacist if you have questions. COMMON BRAND NAME(S): Mobic What should I tell my health care provider before I take this medicine? They need to know if you have any of these conditions: -bleeding disorders -cigarette smoker -coronary artery bypass graft (CABG) surgery within the past 2 weeks -drink more than 3 alcohol-containing drinks per day -heart disease -high blood pressure -history of stomach bleeding -kidney disease -liver disease -lung or breathing disease, like asthma -stomach or intestine problems -an unusual or allergic reaction to meloxicam, aspirin, other NSAIDs, other medicines, foods, dyes, or preservatives -pregnant or trying to get pregnant -breast-feeding How should I use this medicine? Take this medicine by mouth with a full glass of water. Follow the directions on the prescription label. You can take it with or without food. If it  upsets your stomach, take it with food. Take your medicine at regular intervals. Do not take it more often than directed. Do not stop taking except on your doctor's advice. A special MedGuide will be given to you by the pharmacist with each prescription and refill. Be sure to read this information carefully each time. Talk to your pediatrician regarding the use of this medicine in children. While this drug may be prescribed for selected conditions, precautions do apply. Patients over 20 years old may have a stronger reaction and need a smaller dose. Overdosage: If you think you have taken too much of this medicine contact a poison control center or emergency room at once. NOTE: This medicine is only for you. Do not share this medicine with others. What if I miss a dose? If you miss a dose, take it as soon as you can. If it is almost time for your next dose, take only that dose. Do not take double or extra doses. What may interact with this medicine? Do not take this medicine with any of the following medications: -cidofovir -ketorolac This medicine may also interact with the following medications: -aspirin and aspirin-like medicines -certain medicines for blood pressure, heart disease, irregular heart beat -certain medicines for depression, anxiety, or psychotic disturbances -certain medicines that treat or prevent blood clots like warfarin, enoxaparin, dalteparin, apixaban, dabigatran, rivaroxaban -cyclosporine -digoxin -diuretics -methotrexate -other NSAIDs, medicines for pain and inflammation, like ibuprofen and naproxen -pemetrexed This list may not describe all possible interactions. Give your health care provider a list of all the  medicines, herbs, non-prescription drugs, or dietary supplements you use. Also tell them if you smoke, drink alcohol, or use illegal drugs. Some items may interact with your medicine. What should I watch for while using this medicine? Tell your doctor or  healthcare professional if your symptoms do not start to get better or if they get worse. Do not take other medicines that contain aspirin, ibuprofen, or naproxen with this medicine. Side effects such as stomach upset, nausea, or ulcers may be more likely to occur. Many medicines available without a prescription should not be taken with this medicine. This medicine can cause ulcers and bleeding in the stomach and intestines at any time during treatment. This can happen with no warning and may cause death. There is increased risk with taking this medicine for a long time. Smoking, drinking alcohol, older age, and poor health can also increase risks. Call your doctor right away if you have stomach pain or blood in your vomit or stool. This medicine does not prevent heart attack or stroke. In fact, this medicine may increase the chance of a heart attack or stroke. The chance may increase with longer use of this medicine and in people who have heart disease. If you take aspirin to prevent heart attack or stroke, talk with your doctor or health care professional. What side effects may I notice from receiving this medicine? Side effects that you should report to your doctor or health care professional as soon as possible: -allergic reactions like skin rash, itching or hives, swelling of the face, lips, or tongue -nausea, vomiting -signs and symptoms of a blood clot such as breathing problems; changes in vision; chest pain; severe, sudden headache; pain, swelling, warmth in the leg; trouble speaking; sudden numbness or weakness of the face, arm, or leg -signs and symptoms of bleeding such as bloody or black, tarry stools; red or dark-brown urine; spitting up blood or brown material that looks like coffee grounds; red spots on the skin; unusual bruising or bleeding from the eye, gums, or nose -signs and symptoms of liver injury like dark yellow or brown urine; general ill feeling or flu-like symptoms;  light-colored stools; loss of appetite; nausea; right upper belly pain; unusually weak or tired; yellowing of the eyes or skin -signs and symptoms of stroke like changes in vision; confusion; trouble speaking or understanding; severe headaches; sudden numbness or weakness of the face, arm, or leg; trouble walking; dizziness; loss of balance or coordination Side effects that usually do not require medical attention (report to your doctor or health care professional if they continue or are bothersome): -constipation -diarrhea -gas This list may not describe all possible side effects. Call your doctor for medical advice about side effects. You may report side effects to FDA at 1-800-FDA-1088. Where should I keep my medicine? Keep out of the reach of children. Store at room temperature between 15 and 30 degrees C (59 and 86 degrees F). Throw away any unused medicine after the expiration date. NOTE: This sheet is a summary. It may not cover all possible information. If you have questions about this medicine, talk to your doctor, pharmacist, or health care provider.  2018 Elsevier/Gold Standard (2015-11-28 19:28:16)

## 2017-06-30 NOTE — Progress Notes (Addendum)
   06/30/2017  [name] presents for follow up of opioid use disorder I have reviewed the prior induction visit, follow up visits, and telephone encounters relevant to opiate use disorder (OUD) treatment.   Current daily dose: 20mg , 2.5 films per day  Date of Induction: 03/21/17  Current follow up interval, in weeks: 1 weeks  The patient has not been adherent with the buprenorphine for OUD contract.   Last UDS Result: Reviewed at last visit  HPI: At last visit, [name] was not doing very well.  Had admitted to being in full relapse for a few weeks, but was very interested on getting back on the right track.  Dr. Oswaldo Done saw him and discussed plans for adhering to the pain contract, etc.  Since that time, [name] reports he has been doing better.  He is only taking the clonazepam and buprenorphine as prescribed, however, he admits to taking one dose of oxycontin for wrist pain.  He notes that his anxiety flares in the afternoon and evening and he feels that he is having increased trouble at night with pain from his wrist and from anxiety.  He reports that the extra dose of suboxone in the evenings is helping with cravings and he reported to me that he has not used heroin in the last week.  He reports his girlfriend is still an active user, and she injects and snorts heroin.  He has been talking with her about coming to get treatment here as well.  He reports he is not taking Paxil because it does not work.  He missed his Psych appointment b/c he did not have someone to drive him.  He has rescheduled for 8/30 and plans to attend.   As regards his wrist pain, this is now a chronic issue.  He has been complaining of wrist pain since last hospitalization in May.  It was felt that it might be due to his acute infection at that time (though was in an alternate location), but the pain has persisted.  He describes the pain as 10/10, sharp, needle like with associated numbness in his thumb and first 2  fingers.  The pain is worse at night and keeps him up at night.  He does no have any swelling or issues in this elbow, forearm or upper arm.  He reports trying Advil, tylenol and excedrin with limited relief.  He did get some relief with oxycodone.  [place] [facility] database reviewed, and appears that [facility] did not fill recent 7 film Rx from Dr. Oswaldo Done, however, he does report taking suboxone 2.5 daily.  Will need to assess adherence with UDS today which he provided.   Exam:   Vitals:   06/30/17 0820  BP: 126/68  Pulse: 94  Temp: 98 F (36.7 C)  TempSrc: Oral  SpO2: 100%  Weight: 132 lb (59.9 kg)  Height: 5\' 11"  (1.803 m)    General: Limited eye contact, but well dressed and groomed today CV: RR, NR, no murmur Pulm: CTAB, breathing comfortably MSK: + Tinel and Phalen's.  He would not let me hold his wrist in flexion for long as he said the pain returned.  No swelling at the elbow or upper arm, no needle tracks in right arm.  Psych: Flat affect, limited eye contact.   Assessment/Plan:  See Problem Based Charting in the Encounters Tab     Inez Catalina, MD  06/30/2017  8:38 AM

## 2017-06-30 NOTE — Assessment & Plan Note (Signed)
Given exam and reported symptoms, I think he has carpal tunnel syndrome.  Will try treatment with Meloxicam and a wrist brace.  We discussed the nature of CTS and why it may be worse at night.  He was willing to try this treatment plan  Plan Meloxicam 15mg  daily for 7 days Wrist brace Consider NCS in the future if supportive treatment not helpful I am not sure if he would be a good candidate for gabapentin or lyrica, but these could certainly be tried once he is consistently not using heroin.

## 2017-06-30 NOTE — Assessment & Plan Note (Signed)
Continues to be an issue today.  He is no longer taking Paxil as he did not feel it worked after being on for 1 year.  He has had increased difficulty with access to psychiatric care, but recently rescheduled an appointment himself.  He denies using illicit xanax in the last week.  He has worsening pain and anxiety in the evenings.  I think that clonazepam is not a long term solution for him, this should be weaned in favor of an SSRI or other therapy.  He needs to be in psychiatric care.  He has an appointment on 8/30 and I stressed the importance of keeping that appointment.  I did increase his clonazepam today to 2.5 pills PRN for anxiety.  I think that this will help him remain in treatment.  However, I do think that this should be weaned/transferred to his psychiatrist once he is established.  If any further relapse or illicit use of other BZDs, we should strongly consider ceasing prescribing.  If UDS has alprazolam or does not clonazpem, will have discussion with him at next visit about weaning this medication.   Plan Clonazepam 0.5mg  2 times daily and 1/2 tab at night PRN Follow up with psychiatry in the very near future.

## 2017-06-30 NOTE — Assessment & Plan Note (Signed)
He has moderate opioid use disorder and was recently in relapse.  He reports no further heroin use, but did use oxycontin for pain at least once in the last week.  He continues to have social stressors that make another relapse likely (social circle still using, not interested in treatment currently), but he seems to be coping better with this today.    Plan Continue Buprenorphine-naloxone at 2.5 films per day which is helping with cravings.  UDS today.  I again stressed the importance of adhering to plan and not using illicitly obtained substances.

## 2017-07-07 LAB — TOXASSURE SELECT,+ANTIDEPR,UR

## 2017-07-14 ENCOUNTER — Telehealth: Payer: Self-pay | Admitting: *Deleted

## 2017-07-14 ENCOUNTER — Ambulatory Visit (INDEPENDENT_AMBULATORY_CARE_PROVIDER_SITE_OTHER): Payer: Medicaid Other | Admitting: Internal Medicine

## 2017-07-14 VITALS — BP 125/64 | HR 72 | Temp 98.1°F | Ht 71.0 in | Wt 136.0 lb

## 2017-07-14 DIAGNOSIS — F1120 Opioid dependence, uncomplicated: Secondary | ICD-10-CM | POA: Diagnosis not present

## 2017-07-14 DIAGNOSIS — Z79899 Other long term (current) drug therapy: Secondary | ICD-10-CM | POA: Diagnosis not present

## 2017-07-14 DIAGNOSIS — F17210 Nicotine dependence, cigarettes, uncomplicated: Secondary | ICD-10-CM

## 2017-07-14 DIAGNOSIS — F418 Other specified anxiety disorders: Secondary | ICD-10-CM

## 2017-07-14 DIAGNOSIS — G8929 Other chronic pain: Secondary | ICD-10-CM

## 2017-07-14 DIAGNOSIS — M25531 Pain in right wrist: Secondary | ICD-10-CM | POA: Diagnosis not present

## 2017-07-14 MED ORDER — CLONAZEPAM 0.5 MG PO TABS: ORAL_TABLET | ORAL | 0 refills | Status: DC

## 2017-07-14 MED ORDER — BUPRENORPHINE HCL-NALOXONE HCL 8-2 MG SL FILM: ORAL_FILM | SUBLINGUAL | 0 refills | Status: DC

## 2017-07-14 NOTE — Assessment & Plan Note (Signed)
Continues to endorse severe anxiety and depression. On-going illicit xanax use per last UDS. Recently established with psychiatry and patient seems optimistic about therapy. Has a follow up appointment scheduled this week.  -- Refilled klonopin  -- F/u Psychiatry

## 2017-07-14 NOTE — Assessment & Plan Note (Addendum)
Moderate to severe opioid use disorder recently in relapse. Missed last weeks appointment due to transportation issues and has been taking illicit OxyContin to treat withdrawal symptoms, last used 3 days ago. He endorses severe withdrawal symptoms and appears pale and diaphoretic today. He continues to have on going stressors and poor social support (girlfriend currently using heroin). He will continue to encourage his girlfriend to come in for treatment. Per Dr. Donnetta HutchingMullen's conversation with patient, he expressed a desire to continue with suboxone therapy. Today will be his second re-induction. We are hopeful that patient will be more successful now that he has established with psychiatry. However, if he relapses again, patient may require a higher level of care that [facility] cannot provide [facility]. This has previously been discussed with the patient.  -- Restart buprenorphine-naloxone 2.5 films per day  -- F/u UDS today (admits to OxyContin and THC) -- F/u 1 week -- Encouraged patient to keep psychiatry appointments   ADDENDUM: UDS again inappropriate. Positive for Codeine, high levels of morphine, ambien, and oxycodone. Also positive for THC and his prescribed buprenorphine. Will address with patient tomorrow at his scheduled appointment.

## 2017-07-14 NOTE — Assessment & Plan Note (Signed)
Consistent with carpal tunnel syndrome, improved significantly with wrist brace.  -- Continue with brace PRN

## 2017-07-14 NOTE — Progress Notes (Signed)
   07/14/2017  Kevin RaddleMichael P Kuhnert presents for follow up of opioid use disorder I have reviewed the prior induction visit, follow up visits, and telephone encounters relevant to opiate use disorder (OUD) treatment.   Current daily dose: 20 mg Buprenorphine   Date of Induction: 03/21/17  Current follow up interval, in weeks: 1  The patient has not been adherent with the buprenorphine for OUD contract. Re-induced today 07/14/17.   Last UDS Result: Reviewed and inappropriately positive for alprazolam, THC, high levels of morphine. Also inappropriately negative for buprenorphine despite being declared. Appropriate for clonazepam metabolite.   HPI: Patient is a 21 yo M here for follow up of his severe opioid use disorder. Since his last visit, [name] has not been doing very well. He missed his last scheduled appointment due to not having transportation. He has since run out of his suboxone prescription and admits to illicit OxyContin use to treat his withdrawal symptoms and wrist pain from carpal tunnel. He last took OxyContin three days ago and currently endorses severe withdrawal symptoms. He also admits to smoking marijuana. Review of his last UDS is also positive for xanax and high levels of morphine, consistent with his recent relapse. He has begun wearing a wrist brace for his carpal tunnel which he feels has helped his pain significantly.   For his depression and anxiety, patient was able to establish with psychiatry and saw them for his first time last week. He seems optimistic about seeing them and is scheduled to return later this week. Psychiatry has not yet started medication. He continues to take Klonopin for anxiety which we are prescribing.   Per Dr. Criselda PeachesMullen, patient discussed on going goals of care and expressed a desire to continue with suboxone therapy. Today will be his second re-induction. We will continue to follow patient closely, weekly for now. Unfortunately patient has on-going poor  social support and surrounded by friends who continue to use heroine. His girlfriend is actively using and he has encouraged her on multiple occasions to come in and [facility]. We will continue to provide support and encourage patient to attend psychiatry appointments. If patient relapses again, he may require a higher level of care that [facility] cannot [facility]. This has been discussed previously with patient.   Exam:   Vitals:   07/14/17 1012  BP: 125/64  Pulse: 72  Temp: 98.1 F (36.7 C)  TempSrc: Oral  SpO2: 100%  Weight: 136 lb (61.7 kg)  Height: 5\' 11"  (1.803 m)   Physical Exam Constitutional: Pale, diaphoretic  Cardiovascular: RRR Pulmonary/Chest: CTAB, mild expiratory wheezing at RLL Extremities: Warm and well perfused. No edema.  Skin: No rashes or erythema  Psychiatric: Depressed mood, flat affect  Assessment/Plan:  See Problem Based Charting in the Encounters Tab   Reymundo PollGuilloud, Alaa Eyerman, MD  07/14/2017  11:20 AM

## 2017-07-14 NOTE — Patient Instructions (Signed)
[name],  It was a pleasure to see you today. Please continue to take your medication as previously prescribed. Please keep your appointment with psychiatry this week and schedule a follow up appointment with us in 1 week. If you have any questions or concerns, call our clinic at [phone] or after hours call [phone] and ask for the internal medicine resident on call. Thank you!  - Dr. Antony ContrasGuilloud

## 2017-07-14 NOTE — Telephone Encounter (Signed)
[facility] - stated pt had tore off top part of rx ; wanted to know what meds were prescribed today. Only requested Suboxone to be filled - I told her Clonazepam was also prescribed today. Stated she will call pt b/c she knows the importance of taking these medications.

## 2017-07-15 NOTE — Telephone Encounter (Signed)
This is problematic.  If he is not getting clonazepam filled and continues to take Xanax off the street, this will be in violation of his pain contract and our discussion in clinic yesterday.  Attaching Dr. Oswaldo DoneVincent for completeness.  [facility] or tomorrow to discuss with him as well.

## 2017-07-16 NOTE — Progress Notes (Signed)
Internal Medicine Clinic Attending  I saw and evaluated the patient.  I personally confirmed the key portions of the history and exam documented by Dr. Antony ContrasGuilloud and I reviewed pertinent patient test results.  The assessment, diagnosis, and plan were formulated together and I agree with the documentation in the resident's note.  [name] is a 21yo man with severe opioid use disorder and severe anxiety who has been in relapse.  He missed last weeks appointment and has been sparingly taking his buprenorphine to avoid severe withdrawal.  He has been using other substances including oxycontin and THC.  He denies using heroin, but his UDS suggests that he did use heroin recently from last time seen.  He has poor social support and friends/family who continue to use heroin around him.  We had a frank discussion about his continued use of other substances.  I advised him that over the next few weeks we expected him to have a urine which only has prescribed substances in them.  He should not be taking any further pills, including xanax, or other substances.  He expressed understanding, but I am concerned about his ability to follow through with this plan.  He has a lot of concern for his girlfriend who is an active user and I think he feels that if she were in treatment they could face this problem together.  He further has ongoing pain in his wrist and arm that is not well explained, he was wearing a wrist brace today for CTS which he reports helps somewhat. He has seen psychiatry and feels hopeful about that group.  He is due to see them weekly.    Unfortunately, after our visit, I heard from pharmacy that [facility] had torn off his clonazepam prescription and asked only for the buprenorphine to be filled. We had specifically discussed no longer taking illicit xanax, so will need to discuss this with him at next visit and explore his motivations at greater length.    Follow up in 1 week. Given his comorbid psychiatric  and pain conditions, he may very well need a higher level of care such as methadone to succeed in avoiding using heroin.

## 2017-07-18 LAB — TOXASSURE SELECT,+ANTIDEPR,UR

## 2017-07-28 ENCOUNTER — Telehealth: Payer: Self-pay

## 2017-07-28 ENCOUNTER — Ambulatory Visit (INDEPENDENT_AMBULATORY_CARE_PROVIDER_SITE_OTHER): Payer: Medicaid Other | Admitting: Internal Medicine

## 2017-07-28 VITALS — BP 126/49 | HR 77 | Temp 98.2°F | Wt 133.4 lb

## 2017-07-28 DIAGNOSIS — F1120 Opioid dependence, uncomplicated: Secondary | ICD-10-CM | POA: Diagnosis not present

## 2017-07-28 DIAGNOSIS — Z79891 Long term (current) use of opiate analgesic: Secondary | ICD-10-CM

## 2017-07-28 DIAGNOSIS — F17210 Nicotine dependence, cigarettes, uncomplicated: Secondary | ICD-10-CM

## 2017-07-28 DIAGNOSIS — F418 Other specified anxiety disorders: Secondary | ICD-10-CM | POA: Diagnosis present

## 2017-07-28 MED ORDER — BUPRENORPHINE HCL-NALOXONE HCL 8-2 MG SL FILM: ORAL_FILM | SUBLINGUAL | 0 refills | Status: DC

## 2017-07-28 NOTE — Telephone Encounter (Signed)
Our policy is to typically not replace lost or stolen prescription, which is in his treatment agreement. I am going to make a one time exception in this case. It is ok to call in a one week supply for him. [name] is out of second chances, if there are any further contract violations I am inclined to dismiss him from our program.

## 2017-07-28 NOTE — Telephone Encounter (Signed)
Needs to speak with a nurse about Suboxone. Please call pt back.

## 2017-07-28 NOTE — Assessment & Plan Note (Addendum)
Difficult case of a 21 yo M with moderate to severe opioid use disorder currently in relapse. He missed his last appointment due to transportation issues. Last seen on 07/20/17 but missed the appointment prior. He admits to on-going heroin use since running out of his suboxone, last injected yesterday. He endorses withdrawal symptoms of nausea and diaphoresis and is visibly tremulous on exam. Goals of care discussion was had today per Dr. Oswaldo Done with patient continually expressing a desire to stay on suboxone therapy and to "get clean". We have discussed with patient that if he continues to do poorly and unable to stop using he may require a higher level of care than we cannot provide here at [facility]. We have agreed to provide Mr. Secrist with another week of suboxone therapy under the premise that his next UDS must be clean. If he continues to use after this week he is aware that we can no longer provide him with suboxone therapy and would need to establish with either a methadone clinic or [facility] in [place].  -- Restart buprenorphine-naloxone 20 mg daily  -- No UDS today (admits to on going heroin use, also unable to provide sample) -- F/u 1 week

## 2017-07-28 NOTE — Telephone Encounter (Signed)
Rtc, pt's grfather states that he and the grmother were burning trash and she had picked up some "junk mail" off the counter top and did not notice until too late that pt's pink card and script were in the papers, they couldn't get it out in time, pt needs a new script.

## 2017-07-28 NOTE — Progress Notes (Addendum)
   07/28/2017  [name] presents for follow up of opioid use disorder I have reviewed the prior induction visit, follow up visits, and telephone encounters relevant to opiate use disorder (OUD) treatment.   Current daily dose: 20 mg Buprenorphine   Date of Induction: 03/21/2017  Current follow up interval, in weeks: 1   The patient has not been adherent with the buprenorphine for OUD contract.   Last UDS Result: Inappropriate for THC, high levels of morphine, hydromorphone, and ambien. Absent for buprenorphine and klonopin.  HPI: This is a difficult case of a 21 yo M here for follow up of his severe opioid use disorder. Unfortunately patient missed his last follow up appointment due to transportation issues and has continued to do poorly. He was last seen on 07/20/17 but missed the appointment prior. He has been in relapse now for almost 8 weeks. His last UDS was inappropriate for high levels of morphine, hydromorphone, and ambien. Since missing his last appointment he has run out of suboxone. Today he admits to on going heroin use, last injected yesterday. Today he endorses withdrawal symptoms of tremor, nausea, and diaphoresis. Patient has now had multiple conversation regarding on going suboxone treatment and goals of care for his opioid use disorder. He continues to express a desire to stay on suboxone therapy. Unfortunately patient has on-going poor social support and surrounded by friends and family who continue to use. The possibility of requiring a higher level of care has been discussed.   For his depression and anxiety, patient continues to follow with outpatient psychiatry but has not been started on any medication. He was previously started on klonopin however per UDS does not appear that he has been taking it. Last UDS was negative for klonopin despite being declared. He continues to report consistent use. Of note, on chart review there is a recent telephone encounter from  patient's pharmacist after his last visit noting that patient had torn off his klonopin prescription and only requested suboxone to be filled. He admits to on going illicit xanax use today.   [place] controlled substance database reviewed and appropriate.     Exam:   Vitals:   07/28/17 0945  BP: (!) 126/49  Pulse: 77  Temp: 98.2 F (36.8 C)  TempSrc: Oral  SpO2: 100%  Weight: 133 lb 6.4 oz (60.5 kg)    Constitutional: Diaphoretic, tremulous  Cardiovascular: RRR Pulmonary/Chest: CTAB Extremities: Warm and well perfused. No edema.  Skin: No rashes or erythema  Psychiatric: Depressed mood, flat affect    Assessment/Plan:  See Problem Based Charting in the Encounters Tab   Reymundo Poll, MD  07/28/2017  10:28 AM

## 2017-07-28 NOTE — Patient Instructions (Addendum)
Mr. Boening,  It was a pleasure to see you today. Please take suboxone as previously prescribed. Please follow up with [place] in 1 week.   - Dr. Antony Contras

## 2017-07-28 NOTE — Progress Notes (Signed)
Internal Medicine Clinic Attending  I saw and evaluated the patient.  I personally confirmed the key portions of the history and exam documented by Dr. Antony Contras and I reviewed pertinent patient test results.  The assessment, diagnosis, and plan were formulated together and I agree with the documentation in the resident's note.  [name] is a very difficult case of a 21 year old here for follow-up of medication-assisted treatment for severe opioid use disorder. He was started on treatment on May 12 and tolerated the induction phase well in the hospital. He also did well during the stabilization phase as an outpatient. However since mid July he has been in a full relapse, using heroin and OxyContin on almost a daily basis. He's missed 2 appointments over the last 1 month because of transportation issues. He is not taking the prescribed clonazepam as instructed. We have been trying to treat this relapse for the last 8 weeks, unfortunately we are finding it very severe and difficult to control. [name] tells me he still has motivation, he wants to quit and become stable again on the Suboxone. He points to his past success as his goal. I gave him a final warning today that if he is getting continue to use heroin and other illicit opioids, we will have to escalate his care by referring him to a daily addiction treatment Center. He voiced understanding, we set a goal for him to visit with the psychiatrist on Friday, return to clinic on Monday afternoon and provide a clean urine specimen. If he is not able to meet this goal we will bridge him until he can establish with a specialized treatment center.   At the end of our visit [facility] asked for clonazepam to be restarted. I advised that I do not think this is safe given his current opioid relapse. He seemed most frustrated by this response. He will follow up with psychiatry, and we will have more options if he is able to get into OUD remission.

## 2017-07-28 NOTE — Telephone Encounter (Signed)
I have called and spoke to the grandfather, he requested it be called to [facility] [address], called and gave script to [facility]

## 2017-07-28 NOTE — Assessment & Plan Note (Signed)
Continues to endorse uncontrolled anxiety and depression, admits to on going illicit xanax use. Reports he had been compliant with his klonopin but per recent telephone note, pharmacy called reporting patient had ripped off his klonopin prescription and requested only his suboxone be filled. Last UDS was negative for klonopin despite reported use.  -- Discontinue Klonopin for now -- Follow up psychiatry

## 2017-08-03 ENCOUNTER — Ambulatory Visit: Payer: Medicaid Other

## 2017-08-18 ENCOUNTER — Ambulatory Visit (INDEPENDENT_AMBULATORY_CARE_PROVIDER_SITE_OTHER): Payer: Medicaid Other | Admitting: Student in an Organized Health Care Education/Training Program

## 2017-08-18 VITALS — BP 113/62 | HR 89 | Temp 98.4°F | Resp 20 | Ht 71.0 in | Wt 135.2 lb

## 2017-08-18 DIAGNOSIS — F418 Other specified anxiety disorders: Secondary | ICD-10-CM

## 2017-08-18 DIAGNOSIS — F1120 Opioid dependence, uncomplicated: Secondary | ICD-10-CM | POA: Diagnosis present

## 2017-08-18 MED ORDER — BUPRENORPHINE HCL-NALOXONE HCL 8-2 MG SL FILM: 1.0000 | ORAL_FILM | Freq: Three times a day (TID) | SUBLINGUAL | 0 refills | Status: DC

## 2017-08-18 NOTE — Assessment & Plan Note (Addendum)
Patient with severe OUD that is currently uncontrolled and in full relapse. He started medication assisted therapy in May and did well for about 2 months. However since July he has essentially been in full relapse. He reports transportation issues and missed 2 appointments with [place] in the last 3 weeks. He ran out of Suboxone prescription about 2 weeks ago and since has been snorting heroin on a daily basis to stave off withdrawal. At his last visit we set a goal of complete abstinence or we would have to refer him to a higher level of care. Unfortunately he was not able to meet this goal. I provided him with a list of drug treatment centers that do closely monitored medication assisted therapy. I prescribed him Suboxone  TID #21 for a one week supply, he understands how to re-induce at home without precipitating withdrawal. I advised that he follow up with [place] in one week so we can check on his progress in finding a daily treatment center. I want to see him transition to one of these centers on treatment, as opposed to outright dismissal, if possible. I did warn him that if he continues to use while on suboxone we would have to completely stop prescribing. He understood our conversation and is agreeable to calling these facilities today. Will follow up with [place] next week. No u tox collected today because he admits to daily opioid use. Database shows his PCP is now prescribing him Adderall, which he oddly denies.

## 2017-08-18 NOTE — Assessment & Plan Note (Signed)
Still significantly depressed with uncontrolled symptoms. Self treating with illicit xanax. Missed psychiatry appointment because of transportation. I declined to prescribe further benzos, risks outweigh benefit given his full OUD relapse. Instead I advised ceasing xanax use and follow up with psychiatry asap.

## 2017-08-18 NOTE — Progress Notes (Signed)
   08/18/2017  [name] presents for follow up of opioid use disorder I have reviewed the prior induction visit, follow up visits, and telephone encounters relevant to opiate use disorder (OUD) treatment.   Current daily dose: Suboxone  dailyi  Date of Induction: 03/21/17  Current follow up interval, in weeks: 1  The patient has not been adherent with the buprenorphine for OUD contract.   Last UDS Result: inappropriate  HPI: 21 year old man with severe opioid use disorder comes in today for follow-up of recent relapse. I saw the patient 3 weeks ago, he had already been in full relapse for about 2 months at that point. I advise that if he continued to use illicit opioids and benzos that we would have to escalate his care to a higher level of service. The patient had motivation during that visit to do better, and he said he did well for about one week. However he missed his follow-up appointment with [place] because of transportation issues. He subsequently ran out of Suboxone, tried to purchase it on the street. However the last week he has been daily using heroin and pills to keep himself out of withdrawal. Continues to have a very difficult home life, his girlfriend is an opioid user and not interested in treatment. His father continues to be in severe chronic pain and is an active prescription opioid user. He gets rides from his grandfather, which lately has been unreliable, so he has missed 2 appointments with [place] and one appointment with psychiatry. He denies fevers or chills. He reports being very depressed, and uses illicit xanax, unknown dose, several times a day.    Exam:   Vitals:   08/18/17 1028  BP: 113/62  Pulse: 89  Resp: 20  Temp: 98.4 F (36.9 C)  TempSrc: Oral  SpO2: 100%  Weight: 135 lb 3.2 oz (61.3 kg)  Height:  (1.803 m)    Gen: well appearing, sitting up in chair,  Psych: appears anxious with depressed mood. Anhedonic. Nervous. Mildly disheveled but  calm. Neuro: no agitation, conversational, oriented, normal strength thoughout and normal gait  Assessment/Plan:  See Problem Based Charting in the Encounters Tab   Kevin Alias, MD  08/18/2017  10:59 AM

## 2017-08-18 NOTE — Patient Instructions (Signed)
These are the treatment centers we want you to contact as soon as possible. See if you can get set up with one of these in order to get the care you need. We will see you in one week to check on your progress.   [facility] [phone] [address] [place], [place]  [facility] [phone] [address] [place], [place]  [facility] [phone] - [place], [place]  [facility]  Syringe exchange, assistance with treatment [phone]

## 2017-09-01 ENCOUNTER — Encounter (INDEPENDENT_AMBULATORY_CARE_PROVIDER_SITE_OTHER): Payer: Self-pay

## 2017-09-01 ENCOUNTER — Ambulatory Visit (INDEPENDENT_AMBULATORY_CARE_PROVIDER_SITE_OTHER): Payer: Medicaid Other | Admitting: Internal Medicine

## 2017-09-01 DIAGNOSIS — F1120 Opioid dependence, uncomplicated: Secondary | ICD-10-CM

## 2017-09-01 MED ORDER — BUPRENORPHINE HCL-NALOXONE HCL 8-2 MG SL FILM: 1.0000 | ORAL_FILM | Freq: Three times a day (TID) | SUBLINGUAL | 0 refills | Status: AC

## 2017-09-01 NOTE — Assessment & Plan Note (Addendum)
Mr. [name] has been in therapy for his OUD with us since May, but has been in relapse since at least July.  He has not had an appropriate UDS with us since starting therapy.  He has not had appropriate Rx in his urine including prescribed clonazepam.  He has big issues with transportation and has missed multiple appointments.  I think, unfortunately, he cannot be appropriately managed for his OUD in our clinic.  He has a large anxiety/depression component to his continued use of illicit substances.  We were able to get him in to psychiatry, but he has not followed up as requested by that provider.  He also has a large social component with many illicit drug users in his immediate circle who are not interested in abstinence and his parent who is a chronic prescribed opiate user.  [name] also complains of chronic wrist pain which has led him to use oral opiates in the past.    We discussed these issues at length today.  I advised him that he would be better served at a higher level of care and preferably a clinic/addiction care site which also has social work and counseling assistance on site which would provide some relief from his transportation issues.  He has a possible appointment with a new provider today or tomorrow and a phone number, but was vague on details.  I encouraged him to keep this appointment.  I also re-provided him with information about [facility] and other service centers here in [place].    I further discussed with him [facility] the work they are doing with treatment. I provided him a flyer with a phone number and meeting times.    I think he might need to walk [facility] to [facility] if his psychiatric issues are becoming severe.  I did not have a chance to talk with him about that today so our Nurse Myriam JacobsonHelen will call him to follow up.    I encouraged [name] to call us if he is not able to get in to any treatment place, however, I advised him that he will not be receiving any further prescriptions  from us.  I further advised him that if he was not able to get in to psychiatry quickly, that he should contact his PCP Dr. Jeannetta NapElkins to consider starting therapy for depression.    I did provide [name] with a 1 week supply of Buprenorphine-Naloxone today to get him through to his next appointment.  No further refills after this point.

## 2017-09-01 NOTE — Progress Notes (Signed)
   09/01/2017  [name] presents for follow up of opioid use disorder I have reviewed the prior induction visit, follow up visits, and telephone encounters relevant to opiate use disorder (OUD) treatment.    HPI: [name] presents today for final follow up after transition to higher level of care.  Unfortunately, we do not provide counseling, psychiatry or social work in our clinic currently and [name] would do well at a center that provides all of these services given his transportation issues.  He is having withdrawal which he is using suboxone from his last prescription to ease the symptoms.  I suspect he is also using Heroin as he was daily using at last visit.  [name] has been buying Xanax off the streets. He desires to be in therapy including psychiatric therapy, however, with transportation issues, he is having difficulty.  He continues to have people in his circle of friends using illicit substances.   He reports that he has called a clinic on [address] [place] and has a contact with a "Dr. Thayer Ohmhris" to get set up in care today or tomorrow.  He is requesting some suboxone to get him through the week.  He could not tell me the name of the clinic.  He is actively seeking a clinic where he does not need to come in daily.  I think he would be better served in a daily clinic or in one with psychiatric services and we discussed this at length.  He is requesting "something" for his depression.  In our clinic, he has been tried on Buspar, fluoxetine and paroxetine and he has not stayed on therapy.  He was also getting weekly Clonazepam for while, but continued to use illicit alprazolam on top of this prescription.  I advised him that since we were not further following his care, it would not be safe for me to prescribe anything today.  I strongly encouraged him to seek care at a facility that could provide multi-provider care for him.    Exam:   Vitals:   09/01/17 1018  BP: (!)  120/54  Pulse: 69  Resp: 18  Temp: 98.3 F (36.8 C)  TempSrc: Oral  SpO2: 100%  Weight: 134 lb 14.4 oz (61.2 kg)    General: Does not maintain eye contact, well dressed Eyes: Anicteric sclerae Psych: Anxious, depressed.  Reports only using suboxone.    Assessment/Plan:  See Problem Based Charting in the Encounters Tab     Inez CatalinaMullen, Emily B, MD  09/01/2017  10:28 AM

## 2017-09-01 NOTE — Patient Instructions (Signed)
[name] - -  It was good to see you today.  Please make sure you call someone to get set up for further therapy and a higher level of care.  You will likely do better with daily therapy or a group that also has psychiatric care.  One good group which may be able to help you is [facility], which I have provided you a flyer for.    I will give you a one time Rx for Suboxone to get you to your next provider, but after this, our clinic will not be able to provide you with any further prescriptions.    [facility] [phone] [address] [place], [place]  [facility] [phone] [address] - [place], [place]  [facility] [phone] [address] - [place], [place]  [facility]  Syringe exchange, assistance with treatment [phone]
